# Patient Record
Sex: Male | Born: 1955 | Race: White | Hispanic: No | Smoking: Never smoker
Health system: Southern US, Community
[De-identification: ages and names within clinical notes are randomized; demographics above are authoritative.]

## PROBLEM LIST (undated history)

## (undated) DIAGNOSIS — H44813 Hemophthalmos, bilateral: Secondary | ICD-10-CM

## (undated) DIAGNOSIS — I10 Essential (primary) hypertension: Secondary | ICD-10-CM

## (undated) DIAGNOSIS — E079 Disorder of thyroid, unspecified: Secondary | ICD-10-CM

## (undated) DIAGNOSIS — T7840XA Allergy, unspecified, initial encounter: Secondary | ICD-10-CM

## (undated) HISTORY — PX: THYROID SURGERY: SHX805

## (undated) HISTORY — DX: Allergy, unspecified, initial encounter: T78.40XA

---

## 2006-04-30 ENCOUNTER — Emergency Department (HOSPITAL_COMMUNITY): Admission: EM | Admit: 2006-04-30 | Discharge: 2006-05-01 | Payer: Self-pay | Admitting: Emergency Medicine

## 2009-05-17 ENCOUNTER — Emergency Department (HOSPITAL_BASED_OUTPATIENT_CLINIC_OR_DEPARTMENT_OTHER): Admission: EM | Admit: 2009-05-17 | Discharge: 2009-05-17 | Payer: Self-pay | Admitting: Emergency Medicine

## 2012-11-17 ENCOUNTER — Ambulatory Visit: Payer: Self-pay | Admitting: Family Medicine

## 2012-11-20 ENCOUNTER — Ambulatory Visit: Payer: Self-pay | Admitting: Family Medicine

## 2014-11-07 ENCOUNTER — Encounter: Payer: Self-pay | Admitting: Internal Medicine

## 2014-12-04 ENCOUNTER — Encounter (HOSPITAL_BASED_OUTPATIENT_CLINIC_OR_DEPARTMENT_OTHER): Payer: Self-pay | Admitting: Emergency Medicine

## 2014-12-04 ENCOUNTER — Emergency Department (HOSPITAL_BASED_OUTPATIENT_CLINIC_OR_DEPARTMENT_OTHER)
Admission: EM | Admit: 2014-12-04 | Discharge: 2014-12-05 | Disposition: A | Discharge status: Home / Self Care | Payer: BLUE CROSS/BLUE SHIELD | Attending: Emergency Medicine | Admitting: Emergency Medicine

## 2014-12-04 DIAGNOSIS — Z79899 Other long term (current) drug therapy: Secondary | ICD-10-CM | POA: Diagnosis not present

## 2014-12-04 DIAGNOSIS — N289 Disorder of kidney and ureter, unspecified: Secondary | ICD-10-CM | POA: Insufficient documentation

## 2014-12-04 DIAGNOSIS — Z8639 Personal history of other endocrine, nutritional and metabolic disease: Secondary | ICD-10-CM | POA: Insufficient documentation

## 2014-12-04 DIAGNOSIS — R030 Elevated blood-pressure reading, without diagnosis of hypertension: Secondary | ICD-10-CM | POA: Diagnosis present

## 2014-12-04 DIAGNOSIS — I10 Essential (primary) hypertension: Secondary | ICD-10-CM | POA: Insufficient documentation

## 2014-12-04 HISTORY — DX: Essential (primary) hypertension: I10

## 2014-12-04 HISTORY — DX: Disorder of thyroid, unspecified: E07.9

## 2014-12-04 LAB — BASIC METABOLIC PANEL
Anion gap: 2 — ABNORMAL LOW (ref 5–15)
BUN: 28 mg/dL — AB (ref 6–23)
CO2: 24 mmol/L (ref 19–32)
Calcium: 8.6 mg/dL (ref 8.4–10.5)
Chloride: 113 mmol/L — ABNORMAL HIGH (ref 96–112)
Creatinine, Ser: 2.06 mg/dL — ABNORMAL HIGH (ref 0.50–1.35)
GFR calc Af Amer: 39 mL/min — ABNORMAL LOW (ref >90)
GFR, EST NON AFRICAN AMERICAN: 34 mL/min — AB (ref >90)
GLUCOSE: 101 mg/dL — AB (ref 70–99)
POTASSIUM: 3.9 mmol/L (ref 3.5–5.1)
Sodium: 139 mmol/L (ref 135–145)

## 2014-12-04 NOTE — ED Provider Notes (Signed)
CSN: [id number]     Arrival date & time 12/04/14  2113 History  This chart was scribed for Wynetta Fines, MD by Dellis Filbert, ED Scribe. The patient was seen in [place] and the patient's care was started at 11:05 PM.  Chief Complaint  Patient presents with  . Hypertension   HPI  HPI Comments: Howard Green is a 59 y.o. male who presents to the Emergency Department complaining of elevated blood pressure. Pt was diagnosed with hypertension one month ago after a visit with his eye doctor. He was seen at [facility] and started on lisinopril initially at 10 mg. His dose was then increased to 20 mg and finally on February 26 his dose was increased to 30 mg daily taken in the morning. Pt was advised to chart his blood pressure. His values are typically around 150-160s/80s. Tonight his blood pressure was noted to be in the 189-202/80s, which is not typical for him. He denies symptoms, specifically no blurred vision, shortness of breath, chest pain or headache.   Past Medical History  Diagnosis Date  . Hypertension   . Thyroid disease    No past surgical history on file. No family history on file. History  Substance Use Topics  . Smoking status: Not on file  . Smokeless tobacco: Not on file  . Alcohol Use: Not on file    Review of Systems A complete 10 system review of systems was obtained and all systems are negative except as noted in the HPI and PMH.   Allergies  Review of patient's allergies indicates no known allergies.  Home Medications   Prior to Admission medications   Medication Sig Start Date End Date Taking? Authorizing Provider  lisinopril (PRINIVIL,ZESTRIL) 30 MG tablet Take 30 mg by mouth daily.   Yes Historical Provider, MD   BP 199/105 mmHg  Pulse 83  Temp(Src) 98.4 F (36.9 C) (Oral)  Resp 18  Ht 6\' 1"  (1.854 m)  Wt 245 lb (111.131 kg)  BMI 32.33 kg/m2  SpO2 98%   Physical Exam  Nursing note and vitals reviewed. General: Well-developed,  well-nourished male in no acute distress; appearance consistent with age of record HENT: normocephalic; atraumatic Eyes: pupils equal, round and reactive to light; extraocular muscles intact Neck: supple Heart: regular rate and rhythm; no murmur Lungs: clear to auscultation bilaterally Abdomen: soft; nondistended; nontender; no masses or hepatosplenomegaly; bowel sounds present Extremities: No deformity; full range of motion; pulses normal; no edema Neurologic: Awake, alert and oriented; motor function intact in all extremities and symmetric; no facial droop Skin: Warm and dry Psychiatric: Normal mood and affect  ED Course  Procedures  DIAGNOSTIC STUDIES: Oxygen Saturation is 98% on room air, normal by my interpretation.    COORDINATION OF CARE: 11:13 PM Discussed treatment plan with pt at bedside and pt agreed to plan.   MDM   Final diagnoses:  None   Results for orders placed or performed during the hospital encounter of 24/58/09  Basic metabolic panel  Result Value Ref Range   Sodium 139 135 - 145 mmol/L   Potassium 3.9 3.5 - 5.1 mmol/L   Chloride 113 (H) 96 - 112 mmol/L   CO2 24 19 - 32 mmol/L   Glucose, Bld 101 (H) 70 - 99 mg/dL   BUN 28 (H) 6 - 23 mg/dL   Creatinine, Ser 2.06 (H) 0.50 - 1.35 mg/dL   Calcium 8.6 8.4 - 10.5 mg/dL   GFR calc non Af Amer 34 (L) >90  mL/min   GFR calc Af Amer 39 (L) >90 mL/min   Anion gap 2 (L) 5 - 15   Will start on Toprol-XL 25mg  daily and have him follow up with his PCP.  I personally performed the services described in this documentation, which was scribed in my presence. The recorded information has been reviewed and is accurate.    Wynetta Fines, MD 12/05/14 [phone]

## 2014-12-04 NOTE — ED Notes (Signed)
Pt diagnosed with HTN 1 month ago, started on meds, meds increased, high BP continues; extra high tonight

## 2014-12-05 MED ORDER — METOPROLOL SUCCINATE ER 25 MG PO TB24: 25.0000 mg | ORAL_TABLET | Freq: Every day | ORAL | Status: DC

## 2014-12-22 ENCOUNTER — Encounter (HOSPITAL_BASED_OUTPATIENT_CLINIC_OR_DEPARTMENT_OTHER): Payer: Self-pay | Admitting: *Deleted

## 2014-12-22 ENCOUNTER — Emergency Department (HOSPITAL_BASED_OUTPATIENT_CLINIC_OR_DEPARTMENT_OTHER)
Admission: EM | Admit: 2014-12-22 | Discharge: 2014-12-23 | Disposition: A | Discharge status: Home / Self Care | Payer: BLUE CROSS/BLUE SHIELD | Attending: Emergency Medicine | Admitting: Emergency Medicine

## 2014-12-22 DIAGNOSIS — I10 Essential (primary) hypertension: Secondary | ICD-10-CM | POA: Insufficient documentation

## 2014-12-22 DIAGNOSIS — Z8639 Personal history of other endocrine, nutritional and metabolic disease: Secondary | ICD-10-CM | POA: Diagnosis not present

## 2014-12-22 DIAGNOSIS — Z79899 Other long term (current) drug therapy: Secondary | ICD-10-CM | POA: Diagnosis not present

## 2014-12-22 DIAGNOSIS — I160 Hypertensive urgency: Secondary | ICD-10-CM

## 2014-12-22 MED ORDER — CLONIDINE HCL 0.1 MG PO TABS
0.2000 mg | ORAL_TABLET | Freq: Once | ORAL | Status: AC
  Administered 2014-12-23: 0.2 mg via ORAL
  Filled 2014-12-22: qty 2

## 2014-12-22 NOTE — ED Provider Notes (Signed)
CSN: [id number]     Arrival date & time 12/22/14  2252 History   This chart was scribed for Veryl Speak, MD by Chester Holstein, ED Scribe. This patient was seen in room MH01/MH01 and the patient's care was started at 11:53 PM.      Chief Complaint  Patient presents with  . Hypertension     Patient is a 59 y.o. male presenting with hypertension. The history is provided by the patient. No language interpreter was used.  Hypertension   HPI Comments: Steven Basso is a 59 y.o. male who presents to the Emergency Department complaining of elevated blood pressure with gradually increasing today. Pt states he was diagnosed with HTN in February 2016. Pt states he was seen at an ED in [place] at that time and was placed on lisinopril. Pt was seen in ED on 12/04/14 for same and was placed on metoprolol in addition to the lisinopril. Pt notes BP was 150/79 at 10 PM last night. Pt states he has been checking BP today and realized it was increasing. Pt denies headache, chest pain, SOB, and visual disturbances.  11:55 PM  Pt's BP is 207/93  Past Medical History  Diagnosis Date  . Hypertension   . Thyroid disease    Past Surgical History  Procedure Laterality Date  . Thyroid surgery     No family history on file. History  Substance Use Topics  . Smoking status: Never Smoker   . Smokeless tobacco: Not on file  . Alcohol Use: No    Review of Systems A complete 10 system review of systems was obtained and all systems are negative except as noted in the HPI and PMH.     Allergies  Review of patient's allergies indicates no known allergies.  Home Medications   Prior to Admission medications   Medication Sig Start Date End Date Taking? Authorizing Provider  lisinopril (PRINIVIL,ZESTRIL) 30 MG tablet Take 30 mg by mouth daily.    Historical Provider, MD  metoprolol succinate (TOPROL-XL) 25 MG 24 hr tablet Take 1 tablet (25 mg total) by mouth daily. 12/05/14   John Molpus, MD   BP 201/87  mmHg  Pulse 73  Temp(Src) 98.6 F (37 C) (Oral)  Resp 18  Ht 6\' 1"  (1.854 m)  Wt 245 lb (111.131 kg)  BMI 32.33 kg/m2  SpO2 99% Physical Exam  Constitutional: He is oriented to person, place, and time. He appears well-developed and well-nourished.  HENT:  Head: Normocephalic and atraumatic.  Eyes: Conjunctivae and EOM are normal. Pupils are equal, round, and reactive to light.  Neck: Normal range of motion. Neck supple.  Cardiovascular: Normal rate, regular rhythm, normal heart sounds and intact distal pulses.   Pulmonary/Chest: Effort normal and breath sounds normal. No respiratory distress. He has no wheezes. He has no rales.  Abdominal: Soft. He exhibits no distension. There is no tenderness.  Musculoskeletal: Normal range of motion.  Neurological: He is alert and oriented to person, place, and time. No cranial nerve deficit or sensory deficit.  Skin: Skin is warm and dry.  Psychiatric: He has a normal mood and affect. His behavior is normal. Judgment normal.  Nursing note and vitals reviewed.   ED Course  Procedures (including critical care time) DIAGNOSTIC STUDIES: Oxygen Saturation is 99% on room air, normal by my interpretation.    COORDINATION OF CARE: 12:00 AM Discussed treatment plan with patient at beside, the patient agrees with the plan and has no further questions at this time.  Labs Review Labs Reviewed - No data to display  Imaging Review No results found.  ED ECG REPORT   Date: 12/23/2014  Rate: 72  Rhythm: normal sinus rhythm  QRS Axis: normal  Intervals: normal  ST/T Wave abnormalities: normal  Conduction Disutrbances:none  Narrative Interpretation:   Old EKG Reviewed: none available  I have personally reviewed the EKG tracing and agree with the computerized printout as noted.   MDM   Final diagnoses:  None    Patient is a 59 year old male who presents for evaluation of elevated blood pressure. Was seen for similar complaints 2 weeks ago  and started back on his metoprolol. He checked his blood pressure this evening and it was over 206 solid. He presents for evaluation of this. He is neurologically intact and workup reveals no sign of end organ damage. He was given an additional dose of clonidine and he responded well to this. He will be discharged with oral clonidine sad to his antihypertensive regimen for the short-term. He is to keep a record of his blood pressures and present these to his doctor when he follows up in one week.   I personally performed the services described in this documentation, which was scribed in my presence. The recorded information has been reviewed and is accurate.      Veryl Speak, MD 12/23/14 [phone]

## 2014-12-22 NOTE — ED Notes (Signed)
H/o new hypertension since February. Noticed tonight after checking BP at home that it is gradually increasing progressively. Denies any sx. Taking toprol and lisinopril.

## 2014-12-23 ENCOUNTER — Ambulatory Visit (AMBULATORY_SURGERY_CENTER): Payer: Self-pay | Admitting: *Deleted

## 2014-12-23 VITALS — Ht 72.5 in | Wt 241.6 lb

## 2014-12-23 DIAGNOSIS — Z1211 Encounter for screening for malignant neoplasm of colon: Secondary | ICD-10-CM

## 2014-12-23 LAB — CBC WITH DIFFERENTIAL/PLATELET
BASOS PCT: 1 % (ref 0–1)
Basophils Absolute: 0.1 10*3/uL (ref 0.0–0.1)
EOS ABS: 0.4 10*3/uL (ref 0.0–0.7)
Eosinophils Relative: 3 % (ref 0–5)
HEMATOCRIT: 36.9 % — AB (ref 39.0–52.0)
HEMOGLOBIN: 12 g/dL — AB (ref 13.0–17.0)
LYMPHS PCT: 19 % (ref 12–46)
Lymphs Abs: 2.2 10*3/uL (ref 0.7–4.0)
MCH: 27.4 pg (ref 26.0–34.0)
MCHC: 32.5 g/dL (ref 30.0–36.0)
MCV: 84.2 fL (ref 78.0–100.0)
Monocytes Absolute: 0.9 10*3/uL (ref 0.1–1.0)
Monocytes Relative: 8 % (ref 3–12)
NEUTROS ABS: 8.1 10*3/uL — AB (ref 1.7–7.7)
Neutrophils Relative %: 69 % (ref 43–77)
Platelets: 743 10*3/uL — ABNORMAL HIGH (ref 150–400)
RBC: 4.38 MIL/uL (ref 4.22–5.81)
RDW: 14.8 % (ref 11.5–15.5)
WBC: 11.7 10*3/uL — ABNORMAL HIGH (ref 4.0–10.5)

## 2014-12-23 LAB — BASIC METABOLIC PANEL
ANION GAP: 8 (ref 5–15)
BUN: 27 mg/dL — ABNORMAL HIGH (ref 6–23)
CHLORIDE: 110 mmol/L (ref 96–112)
CO2: 24 mmol/L (ref 19–32)
CREATININE: 2.04 mg/dL — AB (ref 0.50–1.35)
Calcium: 8.6 mg/dL (ref 8.4–10.5)
GFR calc Af Amer: 39 mL/min — ABNORMAL LOW (ref >90)
GFR calc non Af Amer: 34 mL/min — ABNORMAL LOW (ref >90)
Glucose, Bld: 115 mg/dL — ABNORMAL HIGH (ref 70–99)
Potassium: 3.8 mmol/L (ref 3.5–5.1)
SODIUM: 142 mmol/L (ref 135–145)

## 2014-12-23 MED ORDER — MOVIPREP 100 G PO SOLR: 1.0000 | Freq: Once | ORAL | Status: DC

## 2014-12-23 MED ORDER — CLONIDINE HCL 0.1 MG PO TABS: 0.1000 mg | ORAL_TABLET | Freq: Two times a day (BID) | ORAL | Status: DC

## 2014-12-23 NOTE — Progress Notes (Signed)
No egg or soy allergy No issues with past sedation No home 02  No diet pills No issues with intubation emmi video to email

## 2014-12-23 NOTE — Discharge Instructions (Signed)
Continue your medications as before. Add clonidine as prescribed today.  Keep a record of your blood pressures for the next week and take this to your primary doctor at your next appointment.  Return to the emergency department for any new and concerning symptoms.   Hypertension Hypertension, commonly called high blood pressure, is when the force of blood pumping through your arteries is too strong. Your arteries are the blood vessels that carry blood from your heart throughout your body. A blood pressure reading consists of a higher number over a lower number, such as 110/72. The higher number (systolic) is the pressure inside your arteries when your heart pumps. The lower number (diastolic) is the pressure inside your arteries when your heart relaxes. Ideally you want your blood pressure below 120/80. Hypertension forces your heart to work harder to pump blood. Your arteries may become narrow or stiff. Having hypertension puts you at risk for heart disease, stroke, and other problems.  RISK FACTORS Some risk factors for high blood pressure are controllable. Others are not.  Risk factors you cannot control include:   Race. You may be at higher risk if you are African American.  Age. Risk increases with age.  Gender. Men are at higher risk than women before age 69 years. After age 50, women are at higher risk than men. Risk factors you can control include:  Not getting enough exercise or physical activity.  Being overweight.  Getting too much fat, sugar, calories, or salt in your diet.  Drinking too much alcohol. SIGNS AND SYMPTOMS Hypertension does not usually cause signs or symptoms. Extremely high blood pressure (hypertensive crisis) may cause headache, anxiety, shortness of breath, and nosebleed. DIAGNOSIS  To check if you have hypertension, your health care provider will measure your blood pressure while you are seated, with your arm held at the level of your heart. It should be  measured at least twice using the same arm. Certain conditions can cause a difference in blood pressure between your right and left arms. A blood pressure reading that is higher than normal on one occasion does not mean that you need treatment. If one blood pressure reading is high, ask your health care provider about having it checked again. TREATMENT  Treating high blood pressure includes making lifestyle changes and possibly taking medicine. Living a healthy lifestyle can help lower high blood pressure. You may need to change some of your habits. Lifestyle changes may include:  Following the DASH diet. This diet is high in fruits, vegetables, and whole grains. It is low in salt, red meat, and added sugars.  Getting at least 2 hours of brisk physical activity every week.  Losing weight if necessary.  Not smoking.  Limiting alcoholic beverages.  Learning ways to reduce stress. If lifestyle changes are not enough to get your blood pressure under control, your health care provider may prescribe medicine. You may need to take more than one. Work closely with your health care provider to understand the risks and benefits. HOME CARE INSTRUCTIONS  Have your blood pressure rechecked as directed by your health care provider.   Take medicines only as directed by your health care provider. Follow the directions carefully. Blood pressure medicines must be taken as prescribed. The medicine does not work as well when you skip doses. Skipping doses also puts you at risk for problems.   Do not smoke.   Monitor your blood pressure at home as directed by your health care provider. SEEK MEDICAL CARE IF:  You think you are having a reaction to medicines taken.  You have recurrent headaches or feel dizzy.  You have swelling in your ankles.  You have trouble with your vision. SEEK IMMEDIATE MEDICAL CARE IF:  You develop a severe headache or confusion.  You have unusual weakness, numbness,  or feel faint.  You have severe chest or abdominal pain.  You vomit repeatedly.  You have trouble breathing. MAKE SURE YOU:   Understand these instructions.  Will watch your condition.  Will get help right away if you are not doing well or get worse. Document Released: 09/20/2005 Document Revised: 02/04/2014 Document Reviewed: 07/13/2013 [facility] Patient Information 2015 [place], [place]. This information is not intended to replace advice given to you by your health care provider. Make sure you discuss any questions you have with your health care provider.

## 2014-12-31 ENCOUNTER — Ambulatory Visit (INDEPENDENT_AMBULATORY_CARE_PROVIDER_SITE_OTHER): Payer: BLUE CROSS/BLUE SHIELD | Admitting: Internal Medicine

## 2014-12-31 ENCOUNTER — Encounter: Payer: Self-pay | Admitting: Internal Medicine

## 2014-12-31 VITALS — BP 172/74 | HR 69 | Temp 98.8°F | Resp 18 | Ht 73.0 in | Wt 234.0 lb

## 2014-12-31 DIAGNOSIS — Z0000 Encounter for general adult medical examination without abnormal findings: Secondary | ICD-10-CM | POA: Diagnosis not present

## 2014-12-31 DIAGNOSIS — N183 Chronic kidney disease, stage 3 (moderate): Secondary | ICD-10-CM | POA: Diagnosis not present

## 2014-12-31 DIAGNOSIS — N179 Acute kidney failure, unspecified: Secondary | ICD-10-CM

## 2014-12-31 DIAGNOSIS — I10 Essential (primary) hypertension: Secondary | ICD-10-CM | POA: Diagnosis not present

## 2014-12-31 MED ORDER — METOPROLOL SUCCINATE ER 50 MG PO TB24: 50.0000 mg | ORAL_TABLET | Freq: Every day | ORAL | Status: DC

## 2014-12-31 MED ORDER — CLONIDINE HCL 0.2 MG PO TABS: 0.2000 mg | ORAL_TABLET | Freq: Two times a day (BID) | ORAL | Status: DC

## 2014-12-31 NOTE — Progress Notes (Signed)
Pre visit review using our clinic review tool, if applicable. No additional management support is needed unless otherwise documented below in the visit note. 

## 2014-12-31 NOTE — Patient Instructions (Addendum)
We will have you stop taking the lisinopril. We will recheck the blood numbers in about 2 weeks to see if the kidneys are doing better. You can go down to the lab anytime between the 11th to the 15th to get it done then come back to see me the next week.   We are increasing the clonidine to 0.2 mg twice a day. I have sent in a new prescription to the [facility]. I have also increased the metoprolol to 50 mg daily and have sent that as well to the [facility].   If you notice the blood pressure going up at home once you change the medicines please call the office sooner to adjust things.   I will send a note to Dr. Henrene Pastor to delay the colon cancer screening.   If the blood test is still showing the damage to the kidney next time we will likely have you do a couple of tests to check on the kidneys.   Hypertension Hypertension, commonly called high blood pressure, is when the force of blood pumping through your arteries is too strong. Your arteries are the blood vessels that carry blood from your heart throughout your body. A blood pressure reading consists of a higher number over a lower number, such as 110/72. The higher number (systolic) is the pressure inside your arteries when your heart pumps. The lower number (diastolic) is the pressure inside your arteries when your heart relaxes. Ideally you want your blood pressure below 120/80. Hypertension forces your heart to work harder to pump blood. Your arteries may become narrow or stiff. Having hypertension puts you at risk for heart disease, stroke, and other problems.  RISK FACTORS Some risk factors for high blood pressure are controllable. Others are not.  Risk factors you cannot control include:   Race. You may be at higher risk if you are African American.  Age. Risk increases with age.  Gender. Men are at higher risk than women before age 13 years. After age 71, women are at higher risk than men. Risk factors you can control include:  Not  getting enough exercise or physical activity.  Being overweight.  Getting too much fat, sugar, calories, or salt in your diet.  Drinking too much alcohol. SIGNS AND SYMPTOMS Hypertension does not usually cause signs or symptoms. Extremely high blood pressure (hypertensive crisis) may cause headache, anxiety, shortness of breath, and nosebleed. DIAGNOSIS  To check if you have hypertension, your health care provider will measure your blood pressure while you are seated, with your arm held at the level of your heart. It should be measured at least twice using the same arm. Certain conditions can cause a difference in blood pressure between your right and left arms. A blood pressure reading that is higher than normal on one occasion does not mean that you need treatment. If one blood pressure reading is high, ask your health care provider about having it checked again. TREATMENT  Treating high blood pressure includes making lifestyle changes and possibly taking medicine. Living a healthy lifestyle can help lower high blood pressure. You may need to change some of your habits. Lifestyle changes may include:  Following the DASH diet. This diet is high in fruits, vegetables, and whole grains. It is low in salt, red meat, and added sugars.  Getting at least 2 hours of brisk physical activity every week.  Losing weight if necessary.  Not smoking.  Limiting alcoholic beverages.  Learning ways to reduce stress. If lifestyle changes  are not enough to get your blood pressure under control, your health care provider may prescribe medicine. You may need to take more than one. Work closely with your health care provider to understand the risks and benefits. HOME CARE INSTRUCTIONS  Have your blood pressure rechecked as directed by your health care provider.   Take medicines only as directed by your health care provider. Follow the directions carefully. Blood pressure medicines must be taken as  prescribed. The medicine does not work as well when you skip doses. Skipping doses also puts you at risk for problems.   Do not smoke.   Monitor your blood pressure at home as directed by your health care provider. SEEK MEDICAL CARE IF:   You think you are having a reaction to medicines taken.  You have recurrent headaches or feel dizzy.  You have swelling in your ankles.  You have trouble with your vision. SEEK IMMEDIATE MEDICAL CARE IF:  You develop a severe headache or confusion.  You have unusual weakness, numbness, or feel faint.  You have severe chest or abdominal pain.  You vomit repeatedly.  You have trouble breathing. MAKE SURE YOU:   Understand these instructions.  Will watch your condition.  Will get help right away if you are not doing well or get worse. Document Released: 09/20/2005 Document Revised: 02/04/2014 Document Reviewed: 07/13/2013 [facility] Patient Information 2015 [place], [place]. This information is not intended to replace advice given to you by your health care provider. Make sure you discuss any questions you have with your health care provider.

## 2015-01-03 ENCOUNTER — Encounter: Payer: Self-pay | Admitting: Internal Medicine

## 2015-01-03 DIAGNOSIS — I10 Essential (primary) hypertension: Secondary | ICD-10-CM | POA: Insufficient documentation

## 2015-01-03 DIAGNOSIS — N179 Acute kidney failure, unspecified: Secondary | ICD-10-CM | POA: Insufficient documentation

## 2015-01-03 NOTE — Assessment & Plan Note (Signed)
BP elevated today and will stop lisinopril per discussion of AKI. Increase clonidine to 0.2 mg BID and continue metoprolol. If needs another agent would add hydralazine next. Repeat labs in 2 weeks off lisinopril.

## 2015-01-03 NOTE — Progress Notes (Signed)
   Subjective:    Patient ID: Howard Green, male    DOB: 1956/02/26, 59 y.o.   MRN: [id number]  HPI The patient is a new 59 YO man who is coming in to talk about his blood pressure and his kidney function. He did not know about his blood pressure until at his eye doctor they took it and sent him to an urgent care. He was then started on a blood pressure medication and went back 1-2 weeks later. He still had high blood pressure and then decided to switch to a different urgent care where they did blood work and added a blood pressure agent. He went back again 2-3 weeks later and did another kidney test (they let him know that the first one had some problems). He thought he should get a regular doctor instead of seeing someone different every time. Denies headaches, chest pains, nausea, confusion. Has not had his pressure checked in some time before all this and never knew he had any health problems.   PMH, [facility], social history reviewed and updated with the patient.   Review of Systems  Constitutional: Negative for fever, chills, activity change, appetite change, fatigue and unexpected weight change.  HENT: Negative.   Eyes: Negative.   Respiratory: Negative for cough, chest tightness, shortness of breath and wheezing.   Cardiovascular: Negative for chest pain, palpitations and leg swelling.  Gastrointestinal: Negative for nausea, abdominal pain, diarrhea and abdominal distention.  Genitourinary: Negative.   Musculoskeletal: Negative.   Skin: Negative.   Neurological: Negative.   Psychiatric/Behavioral: Negative.       Objective:   Physical Exam  Constitutional: He is oriented to person, place, and time. He appears well-developed and well-nourished.  HENT:  Head: Normocephalic and atraumatic.  Eyes: EOM are normal.  Neck: Normal range of motion.  Cardiovascular: Normal rate and regular rhythm.   No murmur heard. Pulmonary/Chest: Effort normal and breath sounds normal.  Abdominal:  Soft. Bowel sounds are normal. He exhibits no distension. There is no tenderness. There is no rebound.  Musculoskeletal: He exhibits no edema.  Neurological: He is alert and oriented to person, place, and time. Coordination normal.  Skin: Skin is warm and dry.   Filed Vitals:   12/31/14 1436  BP: 172/74  Pulse: 69  Temp: 98.8 F (37.1 C)  TempSrc: Oral  Resp: 18  Height: 6\' 1"  (1.854 m)  Weight: 234 lb (106.142 kg)  SpO2: 97%      Assessment & Plan:

## 2015-01-03 NOTE — Assessment & Plan Note (Signed)
Reviewed blood work from the urgent care and his creatinine is elevated with reduced kidney function. Will assume for now that some of this is caused by lisinopril which was the first blood pressure agent started. Will stop lisinopril and increase his clonidine for better BP control. Return in 2 weeks for labs and BP check. May need increase in meds then. If no improvement in his kidney function at that time will do renal ultrasound and workup.

## 2015-01-06 ENCOUNTER — Telehealth: Payer: Self-pay | Admitting: Internal Medicine

## 2015-01-06 ENCOUNTER — Encounter: Payer: BLUE CROSS/BLUE SHIELD | Admitting: Internal Medicine

## 2015-01-14 ENCOUNTER — Other Ambulatory Visit (INDEPENDENT_AMBULATORY_CARE_PROVIDER_SITE_OTHER): Payer: BLUE CROSS/BLUE SHIELD

## 2015-01-14 DIAGNOSIS — N183 Chronic kidney disease, stage 3 (moderate): Secondary | ICD-10-CM | POA: Diagnosis not present

## 2015-01-14 DIAGNOSIS — Z0000 Encounter for general adult medical examination without abnormal findings: Secondary | ICD-10-CM

## 2015-01-14 LAB — LIPID PANEL
CHOLESTEROL: 137 mg/dL (ref 0–200)
HDL: 25.4 mg/dL — ABNORMAL LOW (ref >39.00)
LDL Cholesterol: 84 mg/dL (ref 0–99)
NonHDL: 111.6
TRIGLYCERIDES: 139 mg/dL (ref 0.0–149.0)
Total CHOL/HDL Ratio: 5
VLDL: 27.8 mg/dL (ref 0.0–40.0)

## 2015-01-14 LAB — COMPREHENSIVE METABOLIC PANEL
ALBUMIN: 4.1 g/dL (ref 3.5–5.2)
ALT: 11 U/L (ref 0–53)
AST: 14 U/L (ref 0–37)
Alkaline Phosphatase: 53 U/L (ref 39–117)
BUN: 29 mg/dL — ABNORMAL HIGH (ref 6–23)
CALCIUM: 9.1 mg/dL (ref 8.4–10.5)
CO2: 28 meq/L (ref 19–32)
CREATININE: 2.17 mg/dL — AB (ref 0.40–1.50)
Chloride: 108 mEq/L (ref 96–112)
GFR: 33.17 mL/min — AB (ref >60.00)
GLUCOSE: 108 mg/dL — AB (ref 70–99)
POTASSIUM: 4.5 meq/L (ref 3.5–5.1)
SODIUM: 139 meq/L (ref 135–145)
TOTAL PROTEIN: 6.9 g/dL (ref 6.0–8.3)
Total Bilirubin: 0.5 mg/dL (ref 0.2–1.2)

## 2015-01-14 LAB — HEMOGLOBIN A1C: HEMOGLOBIN A1C: 5.5 % (ref 4.6–6.5)

## 2015-01-16 ENCOUNTER — Other Ambulatory Visit: Payer: Self-pay | Admitting: Internal Medicine

## 2015-01-16 DIAGNOSIS — N289 Disorder of kidney and ureter, unspecified: Secondary | ICD-10-CM

## 2015-01-19 ENCOUNTER — Emergency Department (HOSPITAL_BASED_OUTPATIENT_CLINIC_OR_DEPARTMENT_OTHER): Payer: BLUE CROSS/BLUE SHIELD

## 2015-01-19 ENCOUNTER — Encounter (HOSPITAL_BASED_OUTPATIENT_CLINIC_OR_DEPARTMENT_OTHER): Payer: Self-pay | Admitting: Emergency Medicine

## 2015-01-19 ENCOUNTER — Emergency Department (HOSPITAL_BASED_OUTPATIENT_CLINIC_OR_DEPARTMENT_OTHER)
Admission: EM | Admit: 2015-01-19 | Discharge: 2015-01-19 | Disposition: A | Discharge status: Home / Self Care | Payer: BLUE CROSS/BLUE SHIELD | Attending: Emergency Medicine | Admitting: Emergency Medicine

## 2015-01-19 DIAGNOSIS — I10 Essential (primary) hypertension: Secondary | ICD-10-CM | POA: Diagnosis not present

## 2015-01-19 DIAGNOSIS — R200 Anesthesia of skin: Secondary | ICD-10-CM | POA: Diagnosis not present

## 2015-01-19 DIAGNOSIS — Z79899 Other long term (current) drug therapy: Secondary | ICD-10-CM | POA: Diagnosis not present

## 2015-01-19 DIAGNOSIS — Z8639 Personal history of other endocrine, nutritional and metabolic disease: Secondary | ICD-10-CM | POA: Insufficient documentation

## 2015-01-19 DIAGNOSIS — R202 Paresthesia of skin: Secondary | ICD-10-CM | POA: Diagnosis present

## 2015-01-19 LAB — CBC WITH DIFFERENTIAL/PLATELET
BASOS PCT: 1 % (ref 0–1)
Basophils Absolute: 0.1 10*3/uL (ref 0.0–0.1)
Eosinophils Absolute: 0.4 10*3/uL (ref 0.0–0.7)
Eosinophils Relative: 3 % (ref 0–5)
HCT: 40.5 % (ref 39.0–52.0)
HEMOGLOBIN: 13 g/dL (ref 13.0–17.0)
LYMPHS ABS: 2.7 10*3/uL (ref 0.7–4.0)
Lymphocytes Relative: 21 % (ref 12–46)
MCH: 27.3 pg (ref 26.0–34.0)
MCHC: 32.1 g/dL (ref 30.0–36.0)
MCV: 84.9 fL (ref 78.0–100.0)
MONO ABS: 0.9 10*3/uL (ref 0.1–1.0)
Monocytes Relative: 7 % (ref 3–12)
NEUTROS PCT: 68 % (ref 43–77)
Neutro Abs: 8.8 10*3/uL — ABNORMAL HIGH (ref 1.7–7.7)
Platelets: 752 10*3/uL — ABNORMAL HIGH (ref 150–400)
RBC: 4.77 MIL/uL (ref 4.22–5.81)
RDW: 15.2 % (ref 11.5–15.5)
WBC: 12.9 10*3/uL — AB (ref 4.0–10.5)

## 2015-01-19 LAB — URINALYSIS, ROUTINE W REFLEX MICROSCOPIC
Bilirubin Urine: NEGATIVE
GLUCOSE, UA: NEGATIVE mg/dL
Hgb urine dipstick: NEGATIVE
Ketones, ur: NEGATIVE mg/dL
Leukocytes, UA: NEGATIVE
Nitrite: NEGATIVE
Protein, ur: NEGATIVE mg/dL
Specific Gravity, Urine: 1.01 (ref 1.005–1.030)
Urobilinogen, UA: 0.2 mg/dL (ref 0.0–1.0)
pH: 6.5 (ref 5.0–8.0)

## 2015-01-19 LAB — COMPREHENSIVE METABOLIC PANEL
ALBUMIN: 4.5 g/dL (ref 3.5–5.2)
ALK PHOS: 57 U/L (ref 39–117)
ALT: 16 U/L (ref 0–53)
ANION GAP: 8 (ref 5–15)
AST: 20 U/L (ref 0–37)
BILIRUBIN TOTAL: 0.8 mg/dL (ref 0.3–1.2)
BUN: 35 mg/dL — AB (ref 6–23)
CHLORIDE: 105 mmol/L (ref 96–112)
CO2: 26 mmol/L (ref 19–32)
CREATININE: 2.31 mg/dL — AB (ref 0.50–1.35)
Calcium: 9.3 mg/dL (ref 8.4–10.5)
GFR calc non Af Amer: 29 mL/min — ABNORMAL LOW (ref >90)
GFR, EST AFRICAN AMERICAN: 34 mL/min — AB (ref >90)
GLUCOSE: 103 mg/dL — AB (ref 70–99)
POTASSIUM: 3.9 mmol/L (ref 3.5–5.1)
SODIUM: 139 mmol/L (ref 135–145)
Total Protein: 7.7 g/dL (ref 6.0–8.3)

## 2015-01-19 LAB — TROPONIN I

## 2015-01-19 MED ORDER — LABETALOL HCL 5 MG/ML IV SOLN
INTRAVENOUS | Status: AC
  Administered 2015-01-19: 10 mg via INTRAVENOUS
  Filled 2015-01-19: qty 4

## 2015-01-19 MED ORDER — LABETALOL HCL 5 MG/ML IV SOLN
10.0000 mg | Freq: Once | INTRAVENOUS | Status: AC
  Administered 2015-01-19: 10 mg via INTRAVENOUS

## 2015-01-19 NOTE — Discharge Instructions (Signed)
Begin taking 81 mg aspirin daily today.  Call the number for neurology and your PCP first thing on Monday Transient Ischemic Attack A transient ischemic attack (TIA) is a "warning stroke" that causes stroke-like symptoms. Unlike a stroke, a TIA does not cause permanent damage to the brain. The symptoms of a TIA can happen very fast and do not last long. It is important to know the symptoms of a TIA and what to do. This can help prevent a major stroke or death. CAUSES   A TIA is caused by a temporary blockage in an artery in the brain or neck (carotid artery). The blockage does not allow the brain to get the blood supply it needs and can cause different symptoms. The blockage can be caused by either:  A blood clot.  Fatty buildup (plaque) in a neck or brain artery. RISK FACTORS  High blood pressure (hypertension).  High cholesterol.  Diabetes mellitus.  Heart disease.  The build up of plaque in the blood vessels (peripheral artery disease or atherosclerosis).  The build up of plaque in the blood vessels providing blood and oxygen to the brain (carotid artery stenosis).  An abnormal heart rhythm (atrial fibrillation).  Obesity.  Smoking.  Taking oral contraceptives (especially in combination with smoking).  Physical inactivity.  A diet high in fats, salt (sodium), and calories.  Alcohol use.  Use of illegal drugs (especially cocaine and methamphetamine).  Being male.  Being African American.  Being over the age of 21.  Family history of stroke.  Previous history of blood clots, stroke, TIA, or heart attack.  Sickle cell disease. SYMPTOMS  TIA symptoms are the same as a stroke but are temporary. These symptoms usually develop suddenly, or may be newly present upon awakening from sleep:  Sudden weakness or numbness of the face, arm, or leg, especially on one side of the body.  Sudden trouble walking or difficulty moving arms or legs.  Sudden confusion.  Sudden  personality changes.  Trouble speaking (aphasia) or understanding.  Difficulty swallowing.  Sudden trouble seeing in one or both eyes.  Double vision.  Dizziness.  Loss of balance or coordination.  Sudden severe headache with no known cause.  Trouble reading or writing.  Loss of bowel or bladder control.  Loss of consciousness. DIAGNOSIS  Your caregiver may be able to determine the presence or absence of a TIA based on your symptoms, history, and physical exam. Computed tomography (CT scan) of the brain is usually performed to help identify a TIA. Other tests may be done to diagnose a TIA. These tests may include:  Electrocardiography.  Continuous heart monitoring.  Echocardiography.  Carotid ultrasonography.  Magnetic resonance imaging (MRI).  A scan of the brain circulation.  Blood tests. PREVENTION  The risk of a TIA can be decreased by appropriately treating high blood pressure, high cholesterol, diabetes, heart disease, and obesity and by quitting smoking, limiting alcohol, and staying physically active. TREATMENT  Time is of the essence. Since the symptoms of TIA are the same as a stroke, it is important to seek treatment as soon as possible because you may need a medicine to dissolve the clot (thrombolytic) that cannot be given if too much time has passed. Treatment options vary. Treatment options may include rest, oxygen, intravenous (IV) fluids, and medicines to thin the blood (anticoagulants). Medicines and diet may be used to address diabetes, high blood pressure, and other risk factors. Measures will be taken to prevent short-term and long-term complications, including infection  from breathing foreign material into the lungs (aspiration pneumonia), blood clots in the legs, and falls. Treatment options include procedures to either remove plaque in the carotid arteries or dilate carotid arteries that have narrowed due to plaque. Those procedures are:  Carotid  endarterectomy.  Carotid angioplasty and stenting. HOME CARE INSTRUCTIONS   Take all medicines prescribed by your caregiver. Follow the directions carefully. Medicines may be used to control risk factors for a stroke. Be sure you understand all your medicine instructions.  You may be told to take aspirin or the anticoagulant warfarin. Warfarin needs to be taken exactly as instructed.  Taking too much or too little warfarin is dangerous. Too much warfarin increases the risk of bleeding. Too little warfarin continues to allow the risk for blood clots. While taking warfarin, you will need to have regular blood tests to measure your blood clotting time. A PT blood test measures how long it takes for blood to clot. Your PT is used to calculate another value called an INR. Your PT and INR help your caregiver to adjust your dose of warfarin. The dose can change for many reasons. It is critically important that you take warfarin exactly as prescribed.  Many foods, especially foods high in vitamin K can interfere with warfarin and affect the PT and INR. Foods high in vitamin K include spinach, kale, broccoli, cabbage, collard and turnip greens, brussels sprouts, peas, cauliflower, seaweed, and parsley as well as beef and pork liver, green tea, and soybean oil. You should eat a consistent amount of foods high in vitamin K. Avoid major changes in your diet, or notify your caregiver before changing your diet. Arrange a visit with a dietitian to answer your questions.  Many medicines can interfere with warfarin and affect the PT and INR. You must tell your caregiver about any and all medicines you take, this includes all vitamins and supplements. Be especially cautious with aspirin and anti-inflammatory medicines. Do not take or discontinue any prescribed or over-the-counter medicine except on the advice of your caregiver or pharmacist.  Warfarin can have side effects, such as excessive bruising or bleeding. You  will need to hold pressure over cuts for longer than usual. Your caregiver or pharmacist will discuss other potential side effects.  Avoid sports or activities that may cause injury or bleeding.  Be mindful when shaving, flossing your teeth, or handling sharp objects.  Alcohol can change the body's ability to handle warfarin. It is best to avoid alcoholic drinks or consume only very small amounts while taking warfarin. Notify your caregiver if you change your alcohol intake.  Notify your dentist or other caregivers before procedures.  Eat a diet that includes 5 or more servings of fruits and vegetables each day. This may reduce the risk of stroke. Certain diets may be prescribed to address high blood pressure, high cholesterol, diabetes, or obesity.  A low-sodium, low-saturated fat, low-trans fat, low-cholesterol diet is recommended to manage high blood pressure.  A low-saturated fat, low-trans fat, low-cholesterol, and high-fiber diet may control cholesterol levels.  A controlled-carbohydrate, controlled-sugar diet is recommended to manage diabetes.  A reduced-calorie, low-sodium, low-saturated fat, low-trans fat, low-cholesterol diet is recommended to manage obesity.  Maintain a healthy weight.  Stay physically active. It is recommended that you get at least 30 minutes of activity on most or all days.  Do not smoke.  Limit alcohol use even if you are not taking warfarin. Moderate alcohol use is considered to be:  No more than 2  drinks each day for men.  No more than 1 drink each day for nonpregnant women.  Stop drug abuse.  Home safety. A safe home environment is important to reduce the risk of falls. Your caregiver may arrange for specialists to evaluate your home. Having grab bars in the bedroom and bathroom is often important. Your caregiver may arrange for equipment to be used at home, such as raised toilets and a seat for the shower.  Follow all instructions for follow-up  with your caregiver. This is very important. This includes any referrals and lab tests. Proper follow up can prevent a stroke or another TIA from occurring. SEEK MEDICAL CARE IF:  You have personality changes.  You have difficulty swallowing.  You are seeing double.  You have dizziness.  You have a fever.  You have skin breakdown. SEEK IMMEDIATE MEDICAL CARE IF:  Any of these symptoms may represent a serious problem that is an emergency. Do not wait to see if the symptoms will go away. Get medical help right away. Call your local emergency services (911 in [place] Do not drive yourself to the hospital.  You have sudden weakness or numbness of the face, arm, or leg, especially on one side of the body.  You have sudden trouble walking or difficulty moving arms or legs.  You have sudden confusion.  You have trouble speaking (aphasia) or understanding.  You have sudden trouble seeing in one or both eyes.  You have a loss of balance or coordination.  You have a sudden, severe headache with no known cause.  You have new chest pain or an irregular heartbeat.  You have a partial or total loss of consciousness. MAKE SURE YOU:   Understand these instructions.  Will watch your condition.  Will get help right away if you are not doing well or get worse. Document Released: 06/30/2005 Document Revised: 09/25/2013 Document Reviewed: 12/26/2013 [facility] Patient Information 2015 [place], [place]. This information is not intended to replace advice given to you by your health care provider. Make sure you discuss any questions you have with your health care provider. Hypertension Hypertension, commonly called high blood pressure, is when the force of blood pumping through your arteries is too strong. Your arteries are the blood vessels that carry blood from your heart throughout your body. A blood pressure reading consists of a higher number over a lower number, such as 110/72. The higher  number (systolic) is the pressure inside your arteries when your heart pumps. The lower number (diastolic) is the pressure inside your arteries when your heart relaxes. Ideally you want your blood pressure below 120/80. Hypertension forces your heart to work harder to pump blood. Your arteries may become narrow or stiff. Having hypertension puts you at risk for heart disease, stroke, and other problems.  RISK FACTORS Some risk factors for high blood pressure are controllable. Others are not.  Risk factors you cannot control include:   Race. You may be at higher risk if you are African American.  Age. Risk increases with age.  Gender. Men are at higher risk than women before age 41 years. After age 1, women are at higher risk than men. Risk factors you can control include:  Not getting enough exercise or physical activity.  Being overweight.  Getting too much fat, sugar, calories, or salt in your diet.  Drinking too much alcohol. SIGNS AND SYMPTOMS Hypertension does not usually cause signs or symptoms. Extremely high blood pressure (hypertensive crisis) may cause headache, anxiety, shortness  of breath, and nosebleed. DIAGNOSIS  To check if you have hypertension, your health care provider will measure your blood pressure while you are seated, with your arm held at the level of your heart. It should be measured at least twice using the same arm. Certain conditions can cause a difference in blood pressure between your right and left arms. A blood pressure reading that is higher than normal on one occasion does not mean that you need treatment. If one blood pressure reading is high, ask your health care provider about having it checked again. TREATMENT  Treating high blood pressure includes making lifestyle changes and possibly taking medicine. Living a healthy lifestyle can help lower high blood pressure. You may need to change some of your habits. Lifestyle changes may include:  Following  the DASH diet. This diet is high in fruits, vegetables, and whole grains. It is low in salt, red meat, and added sugars.  Getting at least 2 hours of brisk physical activity every week.  Losing weight if necessary.  Not smoking.  Limiting alcoholic beverages.  Learning ways to reduce stress. If lifestyle changes are not enough to get your blood pressure under control, your health care provider may prescribe medicine. You may need to take more than one. Work closely with your health care provider to understand the risks and benefits. HOME CARE INSTRUCTIONS  Have your blood pressure rechecked as directed by your health care provider.   Take medicines only as directed by your health care provider. Follow the directions carefully. Blood pressure medicines must be taken as prescribed. The medicine does not work as well when you skip doses. Skipping doses also puts you at risk for problems.   Do not smoke.   Monitor your blood pressure at home as directed by your health care provider. SEEK MEDICAL CARE IF:   You think you are having a reaction to medicines taken.  You have recurrent headaches or feel dizzy.  You have swelling in your ankles.  You have trouble with your vision. SEEK IMMEDIATE MEDICAL CARE IF:  You develop a severe headache or confusion.  You have unusual weakness, numbness, or feel faint.  You have severe chest or abdominal pain.  You vomit repeatedly.  You have trouble breathing. MAKE SURE YOU:   Understand these instructions.  Will watch your condition.  Will get help right away if you are not doing well or get worse. Document Released: 09/20/2005 Document Revised: 02/04/2014 Document Reviewed: 07/13/2013 [facility] Patient Information 2015 [place], [place]. This information is not intended to replace advice given to you by your health care provider. Make sure you discuss any questions you have with your health care provider.

## 2015-01-19 NOTE — ED Provider Notes (Signed)
CSN: [id number]     Arrival date & time 01/19/15  0040 History   First MD Initiated Contact with Patient 01/19/15 0057     Chief Complaint  Patient presents with  . Left arm tingling      (Consider location/radiation/quality/duration/timing/severity/associated sxs/prior Treatment) Patient is a 59 y.o. male presenting with general illness.  Illness Location:  L arm, L side, L leg Quality:  Numbness Severity:  Moderate Onset quality:  Gradual Duration:  3 days Timing:  Intermittent Progression:  Worsening Chronicity:  New Context:  HTN, good compliance but with intermittent spikes Relieved by:  Nothing Worsened by:  Nothing Associated symptoms: no abdominal pain, no chest pain (L sided), no fever, no headaches, no loss of consciousness, no nausea, no shortness of breath and no vomiting   Associated symptoms comment:  Numbness   Past Medical History  Diagnosis Date  . Hypertension   . Thyroid disease     benign cyst removed 15 years ago  . Allergy     seasonal like hayfever    Past Surgical History  Procedure Laterality Date  . Thyroid surgery     Family History  Problem Relation Age of Onset  . Colon cancer Neg Hx   . Rectal cancer Neg Hx   . Stomach cancer Neg Hx   . Lung cancer Father     passed 83  . Diabetes Mother   . Other Mother     [place] disease   History  Substance Use Topics  . Smoking status: Never Smoker   . Smokeless tobacco: Never Used  . Alcohol Use: No     Comment: rare    Review of Systems  Constitutional: Negative for fever.  Respiratory: Negative for shortness of breath.   Cardiovascular: Negative for chest pain (L sided).  Gastrointestinal: Negative for nausea, vomiting and abdominal pain.  Neurological: Negative for loss of consciousness and headaches.  All other systems reviewed and are negative.     Allergies  Review of patient's allergies indicates no known allergies.  Home Medications   Prior to Admission  medications   Medication Sig Start Date End Date Taking? Authorizing Provider  cloNIDine (CATAPRES) 0.2 MG tablet Take 1 tablet (0.2 mg total) by mouth 2 (two) times daily. 12/31/14   Olga Millers, MD  metoprolol succinate (TOPROL-XL) 50 MG 24 hr tablet Take 1 tablet (50 mg total) by mouth daily. 12/31/14   Olga Millers, MD  MOVIPREP 100 G SOLR Take 1 kit (200 g total) by mouth once. moviprep as directed. No substitutions Patient not taking: Reported on 12/31/2014 12/23/14   Irene Shipper, MD   BP 147/70 mmHg  Pulse 56  Temp(Src) 98.5 F (36.9 C) (Oral)  Resp 18  Ht 6' 0.5" (1.842 m)  Wt 235 lb (106.595 kg)  BMI 31.42 kg/m2  SpO2 98% Physical Exam  Constitutional: He is oriented to person, place, and time. He appears well-developed and well-nourished.  HENT:  Head: Normocephalic and atraumatic.  Eyes: Conjunctivae and EOM are normal.  Neck: Normal range of motion. Neck supple.  Cardiovascular: Normal rate, regular rhythm and normal heart sounds.   Pulmonary/Chest: Effort normal and breath sounds normal. No respiratory distress.  Abdominal: He exhibits no distension. There is no tenderness. There is no rebound and no guarding.  Musculoskeletal: Normal range of motion.  Neurological: He is alert and oriented to person, place, and time. He has normal strength and normal reflexes. No cranial nerve deficit or sensory deficit.  GCS eye subscore is 4. GCS verbal subscore is 5. GCS motor subscore is 6.  Skin: Skin is warm and dry.  Vitals reviewed.   ED Course  Procedures (including critical care time) Labs Review Labs Reviewed  CBC WITH DIFFERENTIAL/PLATELET - Abnormal; Notable for the following:    WBC 12.9 (*)    Platelets 752 (*)    Neutro Abs 8.8 (*)    All other components within normal limits  COMPREHENSIVE METABOLIC PANEL - Abnormal; Notable for the following:    Glucose, Bld 103 (*)    BUN 35 (*)    Creatinine, Ser 2.31 (*)    GFR calc non Af Amer 29 (*)    GFR  calc Af Amer 34 (*)    All other components within normal limits  URINALYSIS, ROUTINE W REFLEX MICROSCOPIC  TROPONIN I  TROPONIN I    Imaging Review Dg Chest 2 View  01/19/2015   CLINICAL DATA:  Numbness in the left arm down into the leg, on and off. Hypertension.  EXAM: CHEST  2 VIEW  COMPARISON:  None.  FINDINGS: Generous heart size without definitive cardiomegaly. There is mild to moderate aortic tortuosity for age. There is no edema, consolidation, effusion, or pneumothorax. Surgical clips at the thoracic inlet correlating with history of thyroid surgery.  IMPRESSION: No active cardiopulmonary disease.   Electronically Signed   By: Monte Fantasia M.D.   On: 01/19/2015 01:41   Ct Head Wo Contrast  01/19/2015   CLINICAL DATA:  Left arm and leg numbness on and off today. History of hypertension.  EXAM: CT HEAD WITHOUT CONTRAST  TECHNIQUE: Contiguous axial images were obtained from the base of the skull through the vertex without intravenous contrast.  COMPARISON:  None.  FINDINGS: Skull and Sinuses:Negative for fracture or destructive process. No sinusitis or mastoiditis.  Orbits: No acute abnormality.  Brain: No evidence of acute infarction, hemorrhage, hydrocephalus, or mass lesion/mass effect. Brain volume is normal. No white matter disease.  IMPRESSION: Negative head CT.   Electronically Signed   By: Monte Fantasia M.D.   On: 01/19/2015 01:40     EKG Interpretation None     Unable to import EKG to MUSE  Date: 01/19/2015  Rate: 62  Rhythm: normal sinus rhythm  QRS Axis: normal  Intervals: normal  ST/T Wave abnormalities: normal  Conduction Disutrbances: none  Narrative Interpretation: unremarkable EKG, no change from prior     MDM   Final diagnoses:  Essential hypertension  Numbness    59 y.o. male with pertinent PMH of HTN presents with intermittent L sided numbness over the last few weeks, progressively worsening.  He has been seen in the ED and by PCP for HTN, with  progressive changes in BP medications.  He states his symptoms are intermittently in arm and leg and flank, however never in all locations at the same time, generally lasting <1 min.  No pain.  On arrival vitals and physical exam as above.  No focal neuro deficits, and pt asymptomatic at time of my exam.  Given labetalol for BP control.  Wu as above with elevated cr, mildly elevated compared to prior.  CT, CXR, delta trop unremarkable.  Trop obtained due to L shoulder and arm radiation symptoms to ro ACS.    Consider symptoms likely hypertensive urgency, however discussed TIA possibility at length with pt.  He is low risk ABCD2 score (3), and I have instructed him to begin taking ASA daily.  I also informed him  to present immediately if his symptoms began to persist.  I gave him neurology fu.  DC home in stable condition  I have reviewed all laboratory and imaging studies if ordered as above  1. Essential hypertension   2. Numbness         Debby Freiberg, MD 01/19/15 [phone]

## 2015-01-19 NOTE — ED Notes (Signed)
Pt states his left arm and down into left leg had numbness.

## 2015-01-21 ENCOUNTER — Ambulatory Visit
Admission: RE | Admit: 2015-01-21 | Discharge: 2015-01-21 | Disposition: A | Discharge status: Home / Self Care | Payer: BLUE CROSS/BLUE SHIELD | Source: Ambulatory Visit | Attending: Internal Medicine | Admitting: Internal Medicine

## 2015-01-21 DIAGNOSIS — N289 Disorder of kidney and ureter, unspecified: Secondary | ICD-10-CM

## 2015-01-23 ENCOUNTER — Other Ambulatory Visit (INDEPENDENT_AMBULATORY_CARE_PROVIDER_SITE_OTHER): Payer: BLUE CROSS/BLUE SHIELD

## 2015-01-23 ENCOUNTER — Ambulatory Visit (INDEPENDENT_AMBULATORY_CARE_PROVIDER_SITE_OTHER): Payer: BLUE CROSS/BLUE SHIELD | Admitting: Internal Medicine

## 2015-01-23 ENCOUNTER — Encounter: Payer: Self-pay | Admitting: Internal Medicine

## 2015-01-23 VITALS — BP 138/82 | HR 64 | Temp 98.2°F | Resp 16 | Ht 72.05 in | Wt 246.0 lb

## 2015-01-23 DIAGNOSIS — N183 Chronic kidney disease, stage 3 (moderate): Secondary | ICD-10-CM | POA: Diagnosis not present

## 2015-01-23 DIAGNOSIS — R200 Anesthesia of skin: Secondary | ICD-10-CM | POA: Insufficient documentation

## 2015-01-23 DIAGNOSIS — I10 Essential (primary) hypertension: Secondary | ICD-10-CM | POA: Diagnosis not present

## 2015-01-23 DIAGNOSIS — N179 Acute kidney failure, unspecified: Secondary | ICD-10-CM | POA: Diagnosis not present

## 2015-01-23 LAB — RENAL FUNCTION PANEL
Albumin: 4.2 g/dL (ref 3.5–5.2)
BUN: 35 mg/dL — AB (ref 6–23)
CALCIUM: 9 mg/dL (ref 8.4–10.5)
CO2: 27 meq/L (ref 19–32)
CREATININE: 2.3 mg/dL — AB (ref 0.40–1.50)
Chloride: 108 mEq/L (ref 96–112)
GFR: 31.01 mL/min — ABNORMAL LOW (ref >60.00)
Glucose, Bld: 83 mg/dL (ref 70–99)
Phosphorus: 3.5 mg/dL (ref 2.3–4.6)
Potassium: 4.4 mEq/L (ref 3.5–5.1)
Sodium: 140 mEq/L (ref 135–145)

## 2015-01-23 NOTE — Assessment & Plan Note (Signed)
BP controlled, recheck renal panel and refer to nephrology. He has had consistently elevated creatinine (concern for chronic damage versus reversible process). Refer to nephrology to see if kidney biopsy is indicated to look for reversible cause.

## 2015-01-23 NOTE — Assessment & Plan Note (Signed)
He has not had recurrence since leaving ER and is taking ASA 81 mg daily. The episode with his side is likely from exertion but concern over the episode with his left arm. EKG without changes in the ER as well as CT head. He has past injury in the neck and this could be causing some intermittent nerve compression (per his reports has had several episodes and still not good feeling in his 1-3 fingers in left hand all the time). Continue ASA 81 mg daily and referral to neurology.

## 2015-01-23 NOTE — Progress Notes (Signed)
   Subjective:    Patient ID: Howard Green, male    DOB: 1956/06/19, 59 y.o.   MRN: [id number]  HPI The patient is a 60 YO man who is coming in for blood pressure follow up but has been to the ER since last visit (reviewed notes, arm numbness with CT negative for stroke and symptoms resolves advised to follow up with neurology). He had the initial episode of numbness when doing physical exertion. He has been taking ASA 81 mg daily and no recurrence. He is still taking his blood pressure medicine and BP has been controlled at home. No headaches, chest pains, SOB. He does great exertion at his home and no problems with that.   Review of Systems  Constitutional: Negative for fever, chills, activity change, appetite change, fatigue and unexpected weight change.  HENT: Negative.   Eyes: Negative.   Respiratory: Negative for cough, chest tightness, shortness of breath and wheezing.   Cardiovascular: Negative for chest pain, palpitations and leg swelling.  Gastrointestinal: Negative for nausea, abdominal pain, diarrhea and abdominal distention.  Genitourinary: Negative.   Musculoskeletal: Negative.   Skin: Negative.   Neurological: Positive for numbness. Negative for dizziness and weakness.       Resolved now  Psychiatric/Behavioral: Negative.       Objective:   Physical Exam  Constitutional: He is oriented to person, place, and time. He appears well-developed and well-nourished.  HENT:  Head: Normocephalic and atraumatic.  Eyes: EOM are normal.  Neck: Normal range of motion.  Cardiovascular: Normal rate and regular rhythm.   No murmur heard. Pulmonary/Chest: Effort normal and breath sounds normal.  Abdominal: Soft. Bowel sounds are normal. He exhibits no distension. There is no tenderness. There is no rebound.  Musculoskeletal: He exhibits no edema.  Neurological: He is alert and oriented to person, place, and time. Coordination normal.  Skin: Skin is warm and dry.   Filed Vitals:     01/23/15 1445  BP: 138/82  Pulse: 64  Temp: 98.2 F (36.8 C)  TempSrc: Oral  Resp: 16  Height: 6' 0.05" (1.83 m)  Weight: 246 lb (111.585 kg)  SpO2: 98%      Assessment & Plan:

## 2015-01-23 NOTE — Patient Instructions (Signed)
We will recheck the kidney function today and if it still not any better then we will send you to the kidney doctor.   We will also have you keep taking the aspirin 81 mg daily for prevention. We will send in the referral for the neurologist for the arm.   Come back in about 2 months to see how you are doing.

## 2015-01-23 NOTE — Assessment & Plan Note (Signed)
Unclear if this is true AKI and may be more CKD stage 3/4 (depending on which labs you look at). Recheck levels today and referring to nephrology.

## 2015-02-16 ENCOUNTER — Other Ambulatory Visit: Payer: Self-pay | Admitting: Internal Medicine

## 2015-02-28 ENCOUNTER — Encounter: Payer: Self-pay | Admitting: Neurology

## 2015-02-28 ENCOUNTER — Ambulatory Visit (INDEPENDENT_AMBULATORY_CARE_PROVIDER_SITE_OTHER): Payer: BLUE CROSS/BLUE SHIELD | Admitting: Neurology

## 2015-02-28 ENCOUNTER — Telehealth: Payer: Self-pay | Admitting: *Deleted

## 2015-02-28 ENCOUNTER — Other Ambulatory Visit: Payer: Self-pay | Admitting: *Deleted

## 2015-02-28 VITALS — BP 140/84 | HR 92 | Ht 72.0 in | Wt 234.6 lb

## 2015-02-28 DIAGNOSIS — R29818 Other symptoms and signs involving the nervous system: Secondary | ICD-10-CM | POA: Diagnosis not present

## 2015-02-28 DIAGNOSIS — M5412 Radiculopathy, cervical region: Secondary | ICD-10-CM

## 2015-02-28 DIAGNOSIS — M542 Cervicalgia: Secondary | ICD-10-CM | POA: Diagnosis not present

## 2015-02-28 DIAGNOSIS — R202 Paresthesia of skin: Secondary | ICD-10-CM

## 2015-02-28 NOTE — Progress Notes (Signed)
[facility] Clinic Note - Initial Visit   Date: 02/28/2015   Howard Green MRN: [id number] DOB: 03/21/1956   Dear Dr. Shelda Altes:  Thank you for your kind referral of Flavio Lindroth for consultation of left arm paresthesias. Although his history is well known to you, please allow [place] to reiterate it for the purpose of our medical record. The patient was accompanied to the clinic by self.    History of Present Illness: Howard Green is a 59 y.o. right-handed Caucasian male with hypertension presenting for evaluation of left arm pain.  He had noticed left shoulder and arm pain for the past several years, which first started when he was holding his phone on the left hand, when he recalls having transient tingling of the left hand, lasting 30-minutes.  Since then, he has had intermittent spells of the entire arm falling asleep, again lasting 20-30 seconds.  He has difficulty grasping objects.  On one occasional, he was lifting something above his shoulder and developed burning sensation over his left shoulder and numbness over the first three fingers.  He has noticed that certain head and neck movements can trigger these symptoms.  Sometimes, symptoms are worsen after physical activity.  Nothing alleviates it.  There is no associated pain.    On April 17th, he developed left rib cage numbness which prompted him to go to the emergency department with CT head returned normal. He has been diagnosed with hypertension and is concerned whether this may be related to his left arm symptoms.    His leg and face has never been involved. No prior history of TIA/stroke.    Out-side paper records, electronic medical record, and images have been reviewed where available and summarized as:  CT head non contrast 01/19/2015:  Normal  Lab Results  Component Value Date   CHOL 137 01/14/2015   HDL 25.40* 01/14/2015   LDLCALC 84 01/14/2015   TRIG 139.0 01/14/2015   CHOLHDL 5 01/14/2015     Past Medical History  Diagnosis Date  . Hypertension   . Thyroid disease     benign cyst removed 15 years ago  . Allergy     seasonal like hayfever     Past Surgical History  Procedure Laterality Date  . Thyroid surgery       Medications:  Current Outpatient Prescriptions on File Prior to Visit  Medication Sig Dispense Refill  . metoprolol succinate (TOPROL-XL) 50 MG 24 hr tablet TAKE 1 TABLET BY MOUTH EVERY DAY 30 tablet 0   No current facility-administered medications on file prior to visit.    Allergies: No Known Allergies  Family History: Family History  Problem Relation Age of Onset  . Colon cancer Neg Hx   . Rectal cancer Neg Hx   . Stomach cancer Neg Hx   . Lung cancer Father     passed 66  . Diabetes Mother   . Other Mother     Good Pastures disease    Social History: History   Social History  . Marital Status: Married    Spouse Name: N/A  . Number of Children: N/A  . Years of Education: N/A   Occupational History  . Not on file.   Social History Main Topics  . Smoking status: Never Smoker   . Smokeless tobacco: Never Used  . Alcohol Use: No     Comment: rare  . Drug Use: No  . Sexual Activity: Not on file   Other Topics Concern  .  Not on file   Social History Narrative   Lives with wife in a one story home.  Has 4 children.  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       Review of Systems:  CONSTITUTIONAL: No fevers, chills, night sweats, or weight loss.   EYES: No visual changes or eye pain ENT: No hearing changes.  No history of nose bleeds.   RESPIRATORY: No cough, wheezing and shortness of breath.   CARDIOVASCULAR: Negative for chest pain, and palpitations.   GI: Negative for abdominal discomfort, blood in stools or black stools.  No recent change in bowel habits.   GU:  No history of incontinence.   MUSCLOSKELETAL: No history of joint pain or swelling.  No myalgias.   SKIN: Negative for  lesions, rash, and itching.   HEMATOLOGY/ONCOLOGY: Negative for prolonged bleeding, bruising easily, and swollen nodes.  No history of cancer.   ENDOCRINE: Negative for cold or heat intolerance, polydipsia or goiter.   PSYCH:  No depression or anxiety symptoms.   NEURO: As Above.   Vital Signs:  BP 140/84 mmHg  Pulse 92  Ht 6' (1.829 m)  Wt 234 lb 9 oz (106.397 kg)  BMI 31.81 kg/m2  SpO2 93% Pain Scale: 0 on a scale of 0-10   General Medical Exam:   General:  Well appearing, comfortable.   Eyes/ENT: see cranial nerve examination.   Neck: No masses appreciated.  Full range of motion without tenderness.  No carotid bruits. Respiratory:  Clear to auscultation, good air entry bilaterally.   Cardiac:  Regular rate and rhythm, no murmur.   Extremities:  No deformities, edema, or skin discoloration.  Skin:  No rashes or lesions.  Neurological Exam: MENTAL STATUS including orientation to time, place, person, recent and remote memory, attention span and concentration, language, and fund of knowledge is normal.  Speech is not dysarthric.  CRANIAL NERVES: II:  No visual field defects.  Unremarkable fundi.   III-IV-VI: Pupils equal round and reactive to light.  Normal conjugate, extra-ocular eye movements in all directions of gaze.  No nystagmus.  No ptosis.   V:  Normal facial sensation.     VII:  Normal facial symmetry and movements.  No pathologic facial reflexes.  VIII:  Normal hearing and vestibular function.   IX-X:  Normal palatal movement.   XI:  Normal shoulder shrug and head rotation.   XII:  Normal tongue strength and range of motion, no deviation or fasciculation.  MOTOR:  No atrophy, fasciculations or abnormal movements.  No pronator drift.  Tone is normal.    Right Upper Extremity:    Left Upper Extremity:    Deltoid  5/5   Deltoid  5/5   Biceps  5/5   Biceps  5/5   Triceps  5/5   Triceps  5/5   Wrist extensors  5/5   Wrist extensors  5/5   Wrist flexors  5/5   Wrist  flexors  5/5   Finger extensors  5/5   Finger extensors  5/5   Finger flexors  5/5   Finger flexors  5/5   Dorsal interossei  5/5   Dorsal interossei  5/5   Abductor pollicis  5/5   Abductor pollicis  5/5   Tone (Ashworth scale)  0  Tone (Ashworth scale)  0   Right Lower Extremity:    Left Lower Extremity:    Hip flexors  5/5   Hip flexors  5/5   Hip  extensors  5/5   Hip extensors  5/5   Knee flexors  5/5   Knee flexors  5/5   Knee extensors  5/5   Knee extensors  5/5   Dorsiflexors  5/5   Dorsiflexors  5/5   Plantarflexors  5/5   Plantarflexors  5/5   Toe extensors  5/5   Toe extensors  5/5   Toe flexors  5/5   Toe flexors  5/5   Tone (Ashworth scale)  0  Tone (Ashworth scale)  0   MSRs:  Right                                                                 Left brachioradialis 2+  brachioradialis 2+  biceps 2+  biceps 2+  triceps 2+  triceps 2+  patellar 2+  patellar 2+  ankle jerk 2+  ankle jerk 2+  Hoffman no  Hoffman no  plantar response down  plantar response down   SENSORY:  Normal and symmetric perception of light touch, pinprick, vibration, and proprioception.  Romberg's sign absent.   COORDINATION/GAIT: Normal finger-to- nose-finger and heel-to-shin.  Intact rapid alternating movements bilaterally.  Able to rise from a chair without using arms.  Gait mildly antalgic due to left ankle pain (gout).   IMPRESSION: Mr. Basu is a 59 year-old gentleman with hypertension presenting for evaluation of episodic left arm paresthesias.  His exam is non-focal.  Previous work-up has included CT head which was normal.  No prior vessel or cervical imaging. He most likely has cervical radiculopathy especially since symptoms can be triggered or worsened by neck movement, however this would not manifest with thoracic paresthesias, so TIA also needs to be considered.  Stroke risk factors:  Male, age, hypertension.  No history of diabetes and last LDL is within normal limits at  84.   PLAN/RECOMMENDATIONS:  MRI neck without contast US carotids Continue aspirin and statin therapy Return to clinic in 3 months.   The duration of this appointment visit was 35 minutes of face-to-face time with the patient.  Greater than 50% of this time was spent in counseling, explanation of diagnosis, planning of further management, and coordination of care.   Thank you for allowing me to participate in patient's care.  If I can answer any additional questions, I would be pleased to do so.    Sincerely,    Donika K. Posey Pronto, DO

## 2015-02-28 NOTE — Progress Notes (Signed)
Note sent

## 2015-02-28 NOTE — Telephone Encounter (Signed)
PA # [id number] Valid x 30 days

## 2015-02-28 NOTE — Patient Instructions (Addendum)
MRI neck without contast US carotids Return to clinic in 2-3 months

## 2015-03-04 ENCOUNTER — Ambulatory Visit (HOSPITAL_COMMUNITY): Payer: BLUE CROSS/BLUE SHIELD | Attending: Neurology

## 2015-03-04 DIAGNOSIS — R209 Unspecified disturbances of skin sensation: Secondary | ICD-10-CM | POA: Diagnosis not present

## 2015-03-04 DIAGNOSIS — M5412 Radiculopathy, cervical region: Secondary | ICD-10-CM | POA: Diagnosis not present

## 2015-03-04 DIAGNOSIS — R29818 Other symptoms and signs involving the nervous system: Secondary | ICD-10-CM

## 2015-03-04 DIAGNOSIS — R202 Paresthesia of skin: Secondary | ICD-10-CM

## 2015-03-04 DIAGNOSIS — M542 Cervicalgia: Secondary | ICD-10-CM | POA: Insufficient documentation

## 2015-03-05 ENCOUNTER — Telehealth: Payer: Self-pay | Admitting: Internal Medicine

## 2015-03-05 NOTE — Telephone Encounter (Signed)
[facility] (Dr Joelyn Oms) is advising that they would like an EKG ordered ASAP. Please call patient to advise. He did have an EKG at [facility] if this will work.

## 2015-03-06 ENCOUNTER — Telehealth: Payer: Self-pay | Admitting: Neurology

## 2015-03-06 NOTE — Telephone Encounter (Signed)
i called and talked with patient---he has not had any episode with his heart, he is saying dr sanford is requesting a recent ekg be sent to [facility]---i called and left message with dr sanfords assistant advising that patient has appt on 6/14 and that we could do an ekg on that date and have ekg sent/faxed to [facility] at that time---if this is not ok and they need an ekg before then, i advised that [facility] needs to call me back so that i can have patient come to our office before he sees dr Doug Sou on 6/14 to arrange for ekg sooner than 6/14---if [facility]/dr sanford's office calls back, i need to know if 6/14 date for ekg is ok

## 2015-03-06 NOTE — Telephone Encounter (Signed)
MRI cervical spine wo contrast 03/06/2015: 1.  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 2.  Moderate to severe bilateral foraminal stenosis at C6-7. 3.  Moderate right foraminal stenosis at C4-5 4.  Multiple nodules within the thyroid isthmus and left thyroid lobe, recommend dedicated thyroid ultrasound  Results of US carotids (no significant stenosis) as well as MRI as noted above were discussed with the patient.  His left arm paresthesias are most likely caused by disc herniation at this level.  Proceed with occupational therapy and neck PT.  I also will request that his PCP further evaluation the changes noted on his thyroid with US thyroid.  Myana Schlup K. Posey Pronto, DO

## 2015-03-06 NOTE — Telephone Encounter (Signed)
Noted  

## 2015-03-06 NOTE — Telephone Encounter (Signed)
PT order sent

## 2015-03-18 ENCOUNTER — Ambulatory Visit: Payer: BLUE CROSS/BLUE SHIELD | Admitting: Internal Medicine

## 2015-03-21 ENCOUNTER — Other Ambulatory Visit (INDEPENDENT_AMBULATORY_CARE_PROVIDER_SITE_OTHER): Payer: BLUE CROSS/BLUE SHIELD

## 2015-03-21 ENCOUNTER — Ambulatory Visit (INDEPENDENT_AMBULATORY_CARE_PROVIDER_SITE_OTHER): Payer: BLUE CROSS/BLUE SHIELD | Admitting: Internal Medicine

## 2015-03-21 ENCOUNTER — Encounter: Payer: Self-pay | Admitting: Internal Medicine

## 2015-03-21 VITALS — BP 138/78 | HR 85 | Temp 98.7°F | Resp 16 | Ht 73.0 in | Wt 231.0 lb

## 2015-03-21 DIAGNOSIS — Z23 Encounter for immunization: Secondary | ICD-10-CM

## 2015-03-21 DIAGNOSIS — M10271 Drug-induced gout, right ankle and foot: Secondary | ICD-10-CM

## 2015-03-21 DIAGNOSIS — M109 Gout, unspecified: Secondary | ICD-10-CM | POA: Insufficient documentation

## 2015-03-21 DIAGNOSIS — E041 Nontoxic single thyroid nodule: Secondary | ICD-10-CM | POA: Diagnosis not present

## 2015-03-21 DIAGNOSIS — I10 Essential (primary) hypertension: Secondary | ICD-10-CM

## 2015-03-21 LAB — TSH: TSH: 4.37 u[IU]/mL (ref 0.35–4.50)

## 2015-03-21 LAB — T4, FREE: Free T4: 0.67 ng/dL (ref 0.60–1.60)

## 2015-03-21 MED ORDER — COLCHICINE 0.6 MG PO TABS: 0.6000 mg | ORAL_TABLET | Freq: Every day | ORAL | Status: DC

## 2015-03-21 NOTE — Progress Notes (Signed)
   Subjective:    Patient ID: Howard Green, male    DOB: 1956/06/26, 59 y.o.   MRN: [id number]  HPI The patient is a 59 YO man coming back in for follow up of some abnormal labs and findings. He saw his nephrologist who found his TSH to be high. He is having some low energy and constipation. Has had partial thyroidectomy in the past due to nodules. Also he had some nodules found on MRI neck (done by neurology for arm numbness and pain) and recommended US thyroid to characterize. Denies problems swallowing or neck size change.   Also having new problem of his chlorthalidone causing gout flare. Happened about 3 weeks ago in both ankles. Did not take anything for it but stopped the chlorthalidone. Did not call anyone and has not sought care for it. Taking tylenol for pain. Now improving but still some stiffness in his ankles. Has had gout in the past and used to be on uloric. Has not had flare in some time.   Review of Systems  Constitutional: Positive for activity change. Negative for fever, appetite change, fatigue and unexpected weight change.  Respiratory: Negative for cough, chest tightness, shortness of breath and wheezing.   Cardiovascular: Negative for chest pain, palpitations and leg swelling.  Gastrointestinal: Positive for constipation. Negative for nausea, abdominal pain, diarrhea and abdominal distention.  Musculoskeletal: Positive for arthralgias. Negative for myalgias and back pain.  Skin: Negative.   Neurological: Positive for numbness.       Improved in left arm with PT      Objective:   Physical Exam  Constitutional: He is oriented to person, place, and time. He appears well-developed and well-nourished.  HENT:  Head: Normocephalic and atraumatic.  Eyes: EOM are normal.  Neck: Normal range of motion.  Cardiovascular: Normal rate and regular rhythm.   No murmur heard. Pulmonary/Chest: Effort normal and breath sounds normal.  Abdominal: Soft. Bowel sounds are normal. He  exhibits no distension. There is no tenderness. There is no rebound.  Musculoskeletal: He exhibits no edema.  Neurological: He is alert and oriented to person, place, and time. Coordination normal.  Skin: Skin is warm and dry.   Filed Vitals:   03/21/15 0919  BP: 158/82  Pulse: 85  Temp: 98.7 F (37.1 C)  TempSrc: Oral  Resp: 16  Height: 6\' 1"  (1.854 m)  Weight: 231 lb (104.781 kg)  SpO2: 95%      Assessment & Plan:

## 2015-03-21 NOTE — Assessment & Plan Note (Signed)
Off chlorthalidone due to gout flare and advised not to resume. BP okay on amlodipine and toprol-xl. Continue to follow with nephrology.

## 2015-03-21 NOTE — Progress Notes (Signed)
Pre visit review using our clinic review tool, if applicable. No additional management support is needed unless otherwise documented below in the visit note. 

## 2015-03-21 NOTE — Assessment & Plan Note (Signed)
TSH 11 from nephrology and he has had partial thyroidectomy for nodules in the past. MRI found nodules and getting US soft tissue neck to characterize nodules. Checking TSH and free T4 to see if he has deficiency. Advised him that likely he will need to start on synthroid (he was familiar with) and return for labs to adjust dosing in about 6-8 weeks.

## 2015-03-21 NOTE — Assessment & Plan Note (Signed)
Has had gout flares in the past and was on uloric. This event precipitated by chlorthalidone. He has stopped and I have advised him to not resume. Will check uric acid in about 6 weeks to see if elevated. If so can try allopurinol for control of flares. Colchicine sent in to take 0.6 mg daily as needed for pain.

## 2015-03-21 NOTE — Patient Instructions (Signed)
We are going to check blood work today for the thyroid and will likely need to start you on thyroid medicine. We will also check an ultrasound of the neck to make sure there are no thyroid nodules that we need to check out.   Stay off the chlorthalidone. We have sent in colchicine which is a medicine for gout when you have a flare that you can take once a day for pain and stiffness.

## 2015-04-02 ENCOUNTER — Other Ambulatory Visit: Payer: Self-pay | Admitting: Internal Medicine

## 2015-04-03 ENCOUNTER — Ambulatory Visit
Admission: RE | Admit: 2015-04-03 | Discharge: 2015-04-03 | Disposition: A | Discharge status: Home / Self Care | Payer: BLUE CROSS/BLUE SHIELD | Source: Ambulatory Visit | Attending: Internal Medicine | Admitting: Internal Medicine

## 2015-04-03 DIAGNOSIS — E041 Nontoxic single thyroid nodule: Secondary | ICD-10-CM

## 2015-04-15 ENCOUNTER — Encounter: Payer: Self-pay | Admitting: Neurology

## 2015-05-01 ENCOUNTER — Other Ambulatory Visit: Payer: Self-pay | Admitting: Internal Medicine

## 2015-05-05 ENCOUNTER — Ambulatory Visit: Payer: BLUE CROSS/BLUE SHIELD | Admitting: Neurology

## 2015-05-06 ENCOUNTER — Encounter: Payer: Self-pay | Admitting: *Deleted

## 2015-05-30 ENCOUNTER — Other Ambulatory Visit: Payer: Self-pay | Admitting: Internal Medicine

## 2015-06-23 ENCOUNTER — Ambulatory Visit (INDEPENDENT_AMBULATORY_CARE_PROVIDER_SITE_OTHER): Payer: BLUE CROSS/BLUE SHIELD | Admitting: Internal Medicine

## 2015-06-23 ENCOUNTER — Encounter: Payer: Self-pay | Admitting: Internal Medicine

## 2015-06-23 VITALS — BP 170/72 | HR 65 | Temp 98.0°F | Resp 12 | Ht 73.0 in | Wt 240.1 lb

## 2015-06-23 DIAGNOSIS — M10271 Drug-induced gout, right ankle and foot: Secondary | ICD-10-CM

## 2015-06-23 DIAGNOSIS — E041 Nontoxic single thyroid nodule: Secondary | ICD-10-CM

## 2015-06-23 DIAGNOSIS — I10 Essential (primary) hypertension: Secondary | ICD-10-CM | POA: Diagnosis not present

## 2015-06-23 NOTE — Progress Notes (Signed)
Pre visit review using our clinic review tool, if applicable. No additional management support is needed unless otherwise documented below in the visit note. 

## 2015-06-23 NOTE — Patient Instructions (Signed)
We are not changing any of the medicines today and not checking any blood work.   Come back in about 6 months and sooner if you have any problems or questions.

## 2015-06-24 NOTE — Assessment & Plan Note (Signed)
BP elevated moderately today and likely related to his wife's traumatic passing recently. Last BP at goal on same regimen. See him back and adjust as needed. For now continue amlodipine and metoprolol.

## 2015-06-24 NOTE — Progress Notes (Signed)
   Subjective:    Patient ID: Howard Green, male    DOB: 1956-02-13, 59 y.o.   MRN: [id number]  HPI The patient is a 59 YO man who is coming in for follow up of his gout and blood pressure. Since stopping the chlorthalidone his gout is gone now. He has not had any pain and is no longer using the colchicine. His wife passed unexpectedly last week and so his pressures have been up the last 1-2 weeks. He is still struggling to adjust. Taking his medicines as directed without side effects. No headaches or chest pains. Also his son-in-law was in the ICU after a cath for several days in the last 2 weeks. No new concerns. 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   Review of Systems  Constitutional: Positive for activity change. Negative for fever, appetite change, fatigue and unexpected weight change.  Respiratory: Negative for cough, chest tightness, shortness of breath and wheezing.   Cardiovascular: Negative for chest pain, palpitations and leg swelling.  Gastrointestinal: Negative for nausea, abdominal pain, diarrhea, constipation and abdominal distention.  Musculoskeletal: Positive for arthralgias. Negative for myalgias and back pain.  Skin: Negative.   Psychiatric/Behavioral: Positive for sleep disturbance and dysphoric mood. Negative for suicidal ideas and self-injury. The patient is nervous/anxious.       Objective:   Physical Exam Filed Vitals:   06/23/15 1421 06/23/15 1510  BP: 154/82 170/72  Pulse: 65   Temp: 98 F (36.7 C)   TempSrc: Oral   Resp: 12   Height: 6\' 1"  (1.854 m)   Weight: 240 lb 1.9 oz (108.918 kg)   SpO2: 98%      Assessment & Plan:

## 2015-06-24 NOTE — Assessment & Plan Note (Signed)
No recurrence since stopping chlorthalidone. Likely does not need daily treatment but does need to remain off thiazide diuretics.

## 2015-06-24 NOTE — Assessment & Plan Note (Signed)
Previous labs with low normal T4 and he elected not to start treatment. Will continue to monitor every 6 months.

## 2015-06-30 ENCOUNTER — Other Ambulatory Visit: Payer: Self-pay | Admitting: Internal Medicine

## 2015-07-31 ENCOUNTER — Other Ambulatory Visit: Payer: Self-pay | Admitting: Internal Medicine

## 2015-09-03 ENCOUNTER — Other Ambulatory Visit: Payer: Self-pay | Admitting: Internal Medicine

## 2015-10-01 ENCOUNTER — Other Ambulatory Visit: Payer: Self-pay | Admitting: Internal Medicine

## 2015-11-02 ENCOUNTER — Other Ambulatory Visit: Payer: Self-pay | Admitting: Internal Medicine

## 2016-03-02 ENCOUNTER — Encounter (HOSPITAL_BASED_OUTPATIENT_CLINIC_OR_DEPARTMENT_OTHER): Payer: Self-pay

## 2016-03-02 ENCOUNTER — Emergency Department (HOSPITAL_BASED_OUTPATIENT_CLINIC_OR_DEPARTMENT_OTHER)
Admission: EM | Admit: 2016-03-02 | Discharge: 2016-03-02 | Disposition: A | Discharge status: Home / Self Care | Payer: BLUE CROSS/BLUE SHIELD | Attending: Emergency Medicine | Admitting: Emergency Medicine

## 2016-03-02 ENCOUNTER — Emergency Department (HOSPITAL_BASED_OUTPATIENT_CLINIC_OR_DEPARTMENT_OTHER): Payer: BLUE CROSS/BLUE SHIELD

## 2016-03-02 DIAGNOSIS — Y9389 Activity, other specified: Secondary | ICD-10-CM | POA: Insufficient documentation

## 2016-03-02 DIAGNOSIS — I10 Essential (primary) hypertension: Secondary | ICD-10-CM | POA: Diagnosis not present

## 2016-03-02 DIAGNOSIS — S20211A Contusion of right front wall of thorax, initial encounter: Secondary | ICD-10-CM | POA: Diagnosis not present

## 2016-03-02 DIAGNOSIS — Z79899 Other long term (current) drug therapy: Secondary | ICD-10-CM | POA: Insufficient documentation

## 2016-03-02 DIAGNOSIS — E079 Disorder of thyroid, unspecified: Secondary | ICD-10-CM | POA: Insufficient documentation

## 2016-03-02 DIAGNOSIS — Y929 Unspecified place or not applicable: Secondary | ICD-10-CM | POA: Insufficient documentation

## 2016-03-02 DIAGNOSIS — Y999 Unspecified external cause status: Secondary | ICD-10-CM | POA: Diagnosis not present

## 2016-03-02 DIAGNOSIS — S299XXA Unspecified injury of thorax, initial encounter: Secondary | ICD-10-CM | POA: Diagnosis present

## 2016-03-02 MED ORDER — TRAMADOL HCL 50 MG PO TABS: 50.0000 mg | ORAL_TABLET | Freq: Four times a day (QID) | ORAL | Status: DC | PRN

## 2016-03-02 NOTE — Discharge Instructions (Signed)
Chest Contusion °A contusion is a deep bruise. Bruises happen when an injury causes bleeding under the skin. Signs of bruising include pain, puffiness (swelling), and discolored skin. The bruise may turn blue, purple, or yellow.  °HOME CARE °· Put ice on the injured area. °¨ Put ice in a plastic bag. °¨ Place a towel between the skin and the bag. °¨ Leave the ice on for 15-20 minutes at a time, 03-04 times a day for the first 48 hours. °· Only take medicine as told by your doctor. °· Rest. °· Take deep breaths (deep-breathing exercises) as told by your doctor. °· Stop smoking if you smoke. °· Do not lift objects over 5 pounds (2.3 kilograms) for 3 days or longer if told by your doctor. °GET HELP RIGHT AWAY IF:  °· You have more bruising or puffiness. °· You have pain that gets worse. °· You have trouble breathing. °· You are dizzy, weak, or pass out (faint). °· You have blood in your pee (urine) or poop (stool). °· You cough up or throw up (vomit) blood. °· Your puffiness or pain is not helped with medicines. °MAKE SURE YOU:  °· Understand these instructions. °· Will watch your condition. °· Will get help right away if you are not doing well or get worse. °  °This information is not intended to replace advice given to you by your health care provider. Make sure you discuss any questions you have with your health care provider. °  °Document Released: 03/08/2008 Document Revised: 06/14/2012 Document Reviewed: 03/13/2012 °Elsevier Interactive Patient Education ©2016 [employer] ° °

## 2016-03-02 NOTE — ED Provider Notes (Signed)
CSN: [id number]     Arrival date & time 03/02/16  2000 History   By signing my name below, I, Howard Green, attest that this documentation has been prepared under the direction and in the presence of [name], MD.  Electronically Signed: Tedra Coupe. Sheppard Coil, ED Scribe. 03/02/2016. 8:19 PM.    Chief Complaint  Patient presents with  . Rib Injury    The history is provided by the patient. No language interpreter was used.    HPI Comments: Howard Green is a 60 y.o. male who presents to the Emergency Department s/p fall that occurred PTA. Pt reports that he fell after unsaddlng his horse, which caused the animal to "get spooked" and push him backwards onto the ground. He states that the animal did not cause any physical harm to his body. No LOC. Pt complains of impact pain on right arm and shoulder, and pain in upper right ribs. There are no modifying factors. He denies any nausea, vomiting, shortness of breath, pain to bilateral lower extremities, or head/neck injury.  Past Medical History  Diagnosis Date  . Hypertension   . Thyroid disease     benign cyst removed 15 years ago  . Allergy     seasonal like hayfever    Past Surgical History  Procedure Laterality Date  . Thyroid surgery     Family History  Problem Relation Age of Onset  . Colon cancer Neg Hx   . Rectal cancer Neg Hx   . Stomach cancer Neg Hx   . Lung cancer Father     passed 6  . Diabetes Mother   . Other Mother     Good Pastures disease  . Healthy Brother   . Healthy Sister   . Healthy Son   . Healthy Daughter    Social History  Substance Use Topics  . Smoking status: Never Smoker   . Smokeless tobacco: Never Used  . Alcohol Use: No    Review of Systems  Constitutional: Negative for fever.  Respiratory: Negative for shortness of breath.   Cardiovascular: Negative for chest pain.  Gastrointestinal: Negative for nausea and vomiting.  Musculoskeletal: Positive for myalgias and  arthralgias. Negative for neck pain.  All other systems reviewed and are negative.   Allergies  Review of patient's allergies indicates no known allergies.  Home Medications   Prior to Admission medications   Medication Sig Start Date End Date Taking? Authorizing Provider  torsemide (DEMADEX) 20 MG tablet Take 20 mg by mouth daily.   Yes Historical Provider, MD  amLODipine (NORVASC) 10 MG tablet Take 10 mg by mouth daily.  02/19/15   Historical Provider, MD  aspirin 81 MG tablet Take 81 mg by mouth daily.    Historical Provider, MD  metoprolol succinate (TOPROL-XL) 50 MG 24 hr tablet TAKE 1 TABLET BY MOUTH DAILY 11/03/15   [name], MD  traMADol (ULTRAM) 50 MG tablet Take 1 tablet (50 mg total) by mouth every 6 (six) hours as needed. 03/02/16   [name], MD   BP 164/88 mmHg  Pulse 96  Temp(Src) 98.8 F (37.1 C) (Oral)  Resp 18  Ht 6\' 1"  (1.854 m)  Wt 245 lb (111.131 kg)  BMI 32.33 kg/m2  SpO2 95% Physical Exam  Constitutional: He is oriented to person, place, and time. He appears well-developed and well-nourished.  HENT:  Head: Normocephalic and atraumatic.  Eyes: EOM are normal. Pupils are equal, round, and reactive to light.  Neck: Normal range  of motion.  Cardiovascular: Normal rate, regular rhythm and normal heart sounds.   Pulmonary/Chest: Effort normal and breath sounds normal. No respiratory distress. He has no wheezes. He has no rales.  Abdominal: Soft. Bowel sounds are normal. He exhibits no distension. There is no tenderness. There is no rebound and no guarding.  Musculoskeletal: Normal range of motion. He exhibits tenderness.  No Bony Tenderness of Right Arm. Tenderness upon palpation of right upper ribs.  Neurological: He is alert and oriented to person, place, and time.  Skin: Skin is warm and dry. No rash noted.  Bruising to right arm.  Psychiatric: He has a normal mood and affect. Judgment normal.  Nursing note and vitals reviewed.   ED Course   Procedures (including critical care time) DIAGNOSTIC STUDIES: Oxygen Saturation is 95% on RA, normal by my interpretation.    COORDINATION OF CARE: 8:17 PM-Discussed treatment plan which includes Right CXR with pt at bedside and pt agreed to plan. Pt was offered pain medications which he declined at this time.  Labs Review Labs Reviewed - No data to display  Imaging Review Dg Ribs Unilateral W/chest Right  03/02/2016  CLINICAL DATA:  Anterior rib pain, pushed over by horse EXAM: RIGHT RIBS AND CHEST - 3+ VIEW COMPARISON:  Chest radiographs dated 01/19/2015 FINDINGS: Lungs are clear.  No pleural effusion or pneumothorax. The heart is normal in size. No displaced right rib fracture is seen. IMPRESSION: No evidence of acute cardiopulmonary disease. No displaced right rib fracture is seen. Electronically Signed   By: Julian Hy M.D.   On: 03/02/2016 21:19   I have personally reviewed and evaluated these images and lab results as part of my medical decision-making.   EKG Interpretation None     MDM  PT DOES NOT WANT ANYTHING STRONG FOR PAIN AND HE CAN'T TAKE NSAIDS DUE TO RENAL INSUFFICIENCY.  HE AGREED TO TRY TRAMADOL RX TO SEE IF THAT WILL HELP.  HE WILL TAKE TYLENOL 1ST.  PT KNOWS TO RETURN IF WORSE. Final diagnoses:  Rib contusion, right, initial encounter     I personally performed the services described in this documentation, which was scribed in my presence. The recorded information has been reviewed and is accurate.   [name], MD 03/02/16 2132

## 2016-03-02 NOTE — ED Notes (Signed)
Pt teaching provided on medications that may cause drowsiness. Pt instructed not to drive or operate heavy machinery while taking the prescribed medication. Pt verbalized understanding.   

## 2016-03-02 NOTE — ED Notes (Signed)
Pt knocked down by horse-pain to right rib area-denies head or neck injury-NAD-steady gait to tx area

## 2016-04-09 ENCOUNTER — Other Ambulatory Visit: Payer: Self-pay | Admitting: Internal Medicine

## 2016-05-06 ENCOUNTER — Other Ambulatory Visit: Payer: Self-pay | Admitting: Internal Medicine

## 2016-06-10 ENCOUNTER — Other Ambulatory Visit: Payer: Self-pay | Admitting: Internal Medicine

## 2016-06-27 IMAGING — CR DG CHEST 2V
2 series · 2 of 2 positions shown · non-contrast
Comparison: None.

CLINICAL DATA: Numbness in the left arm down into the leg, on and
off. Hypertension.

EXAM:
CHEST  2 VIEW

[w chest pa]
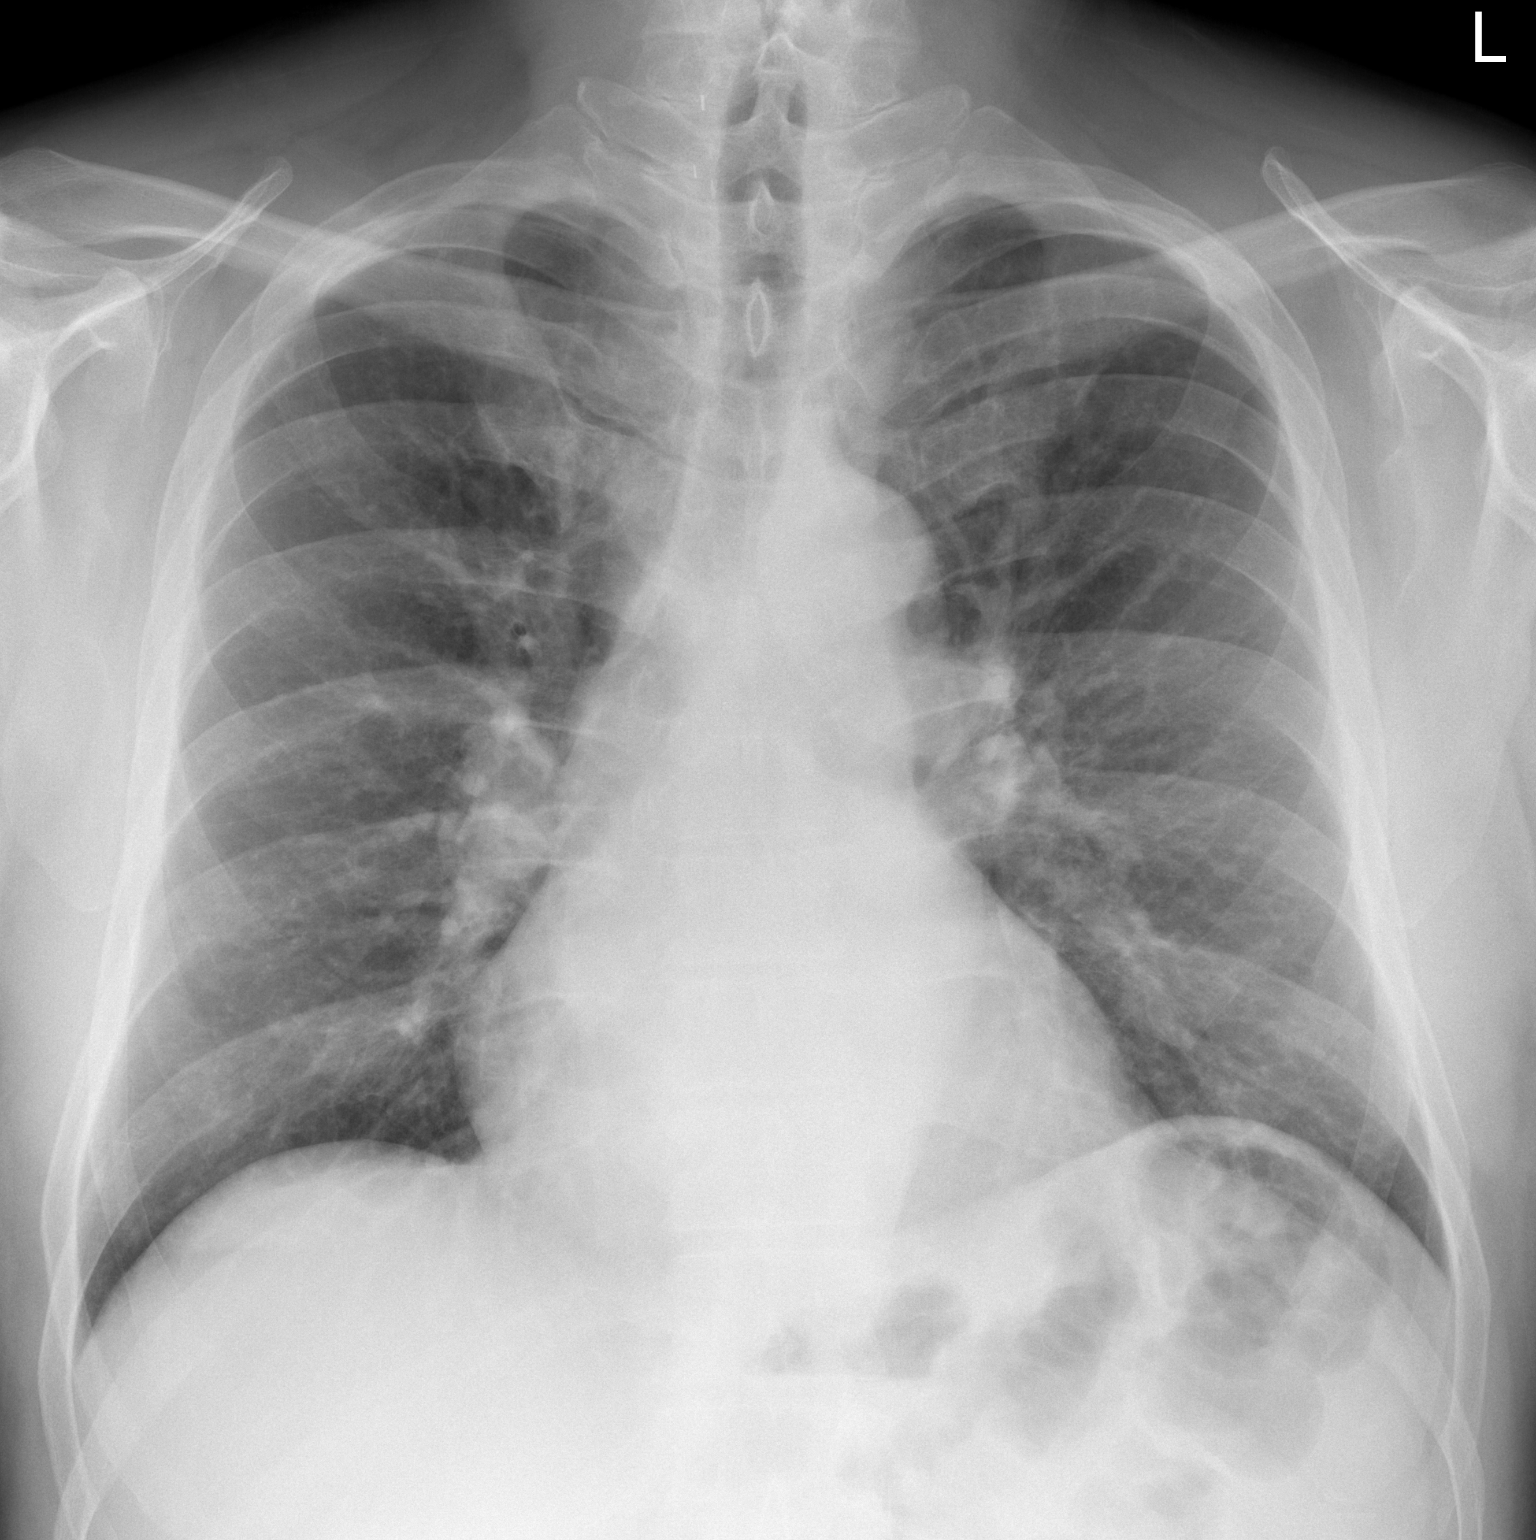

[w chest lat]
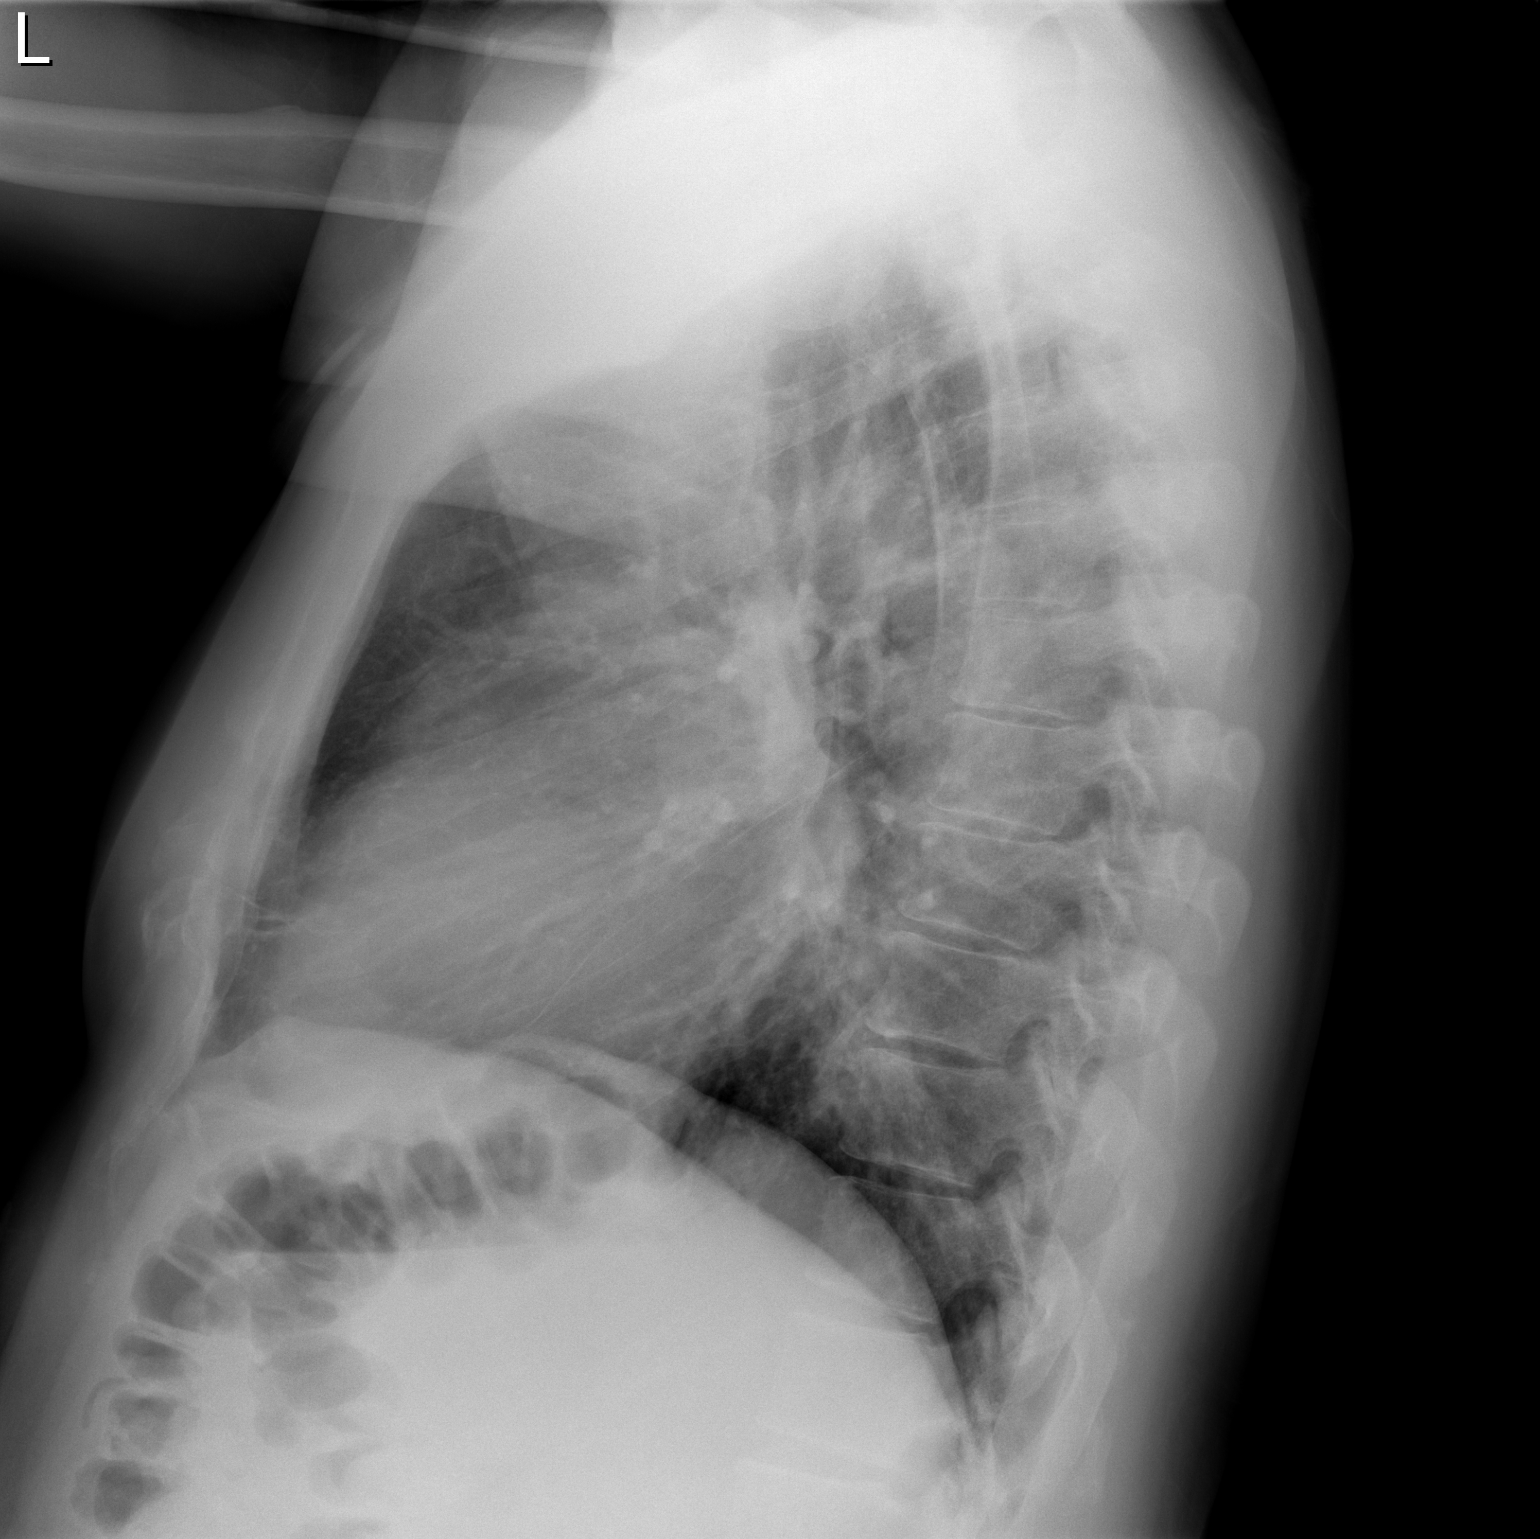

[2 of 2 positions shown; findings below may reference images not displayed]

FINDINGS: Generous heart size without definitive cardiomegaly. There is mild
to moderate aortic tortuosity for age. There is no edema,
consolidation, effusion, or pneumothorax. Surgical clips at the
thoracic inlet correlating with history of thyroid surgery.
IMPRESSION: No active cardiopulmonary disease.

## 2016-06-29 IMAGING — US US RENAL
1 series · 14 of 25 positions shown · non-contrast
Comparison: None.

CLINICAL DATA: Renal insufficiency.

EXAM:
RENAL/URINARY TRACT ULTRASOUND COMPLETE

[Series 1: us renal · 0.26mm/px · 14 of 63 slices shown]
[im 1/63]
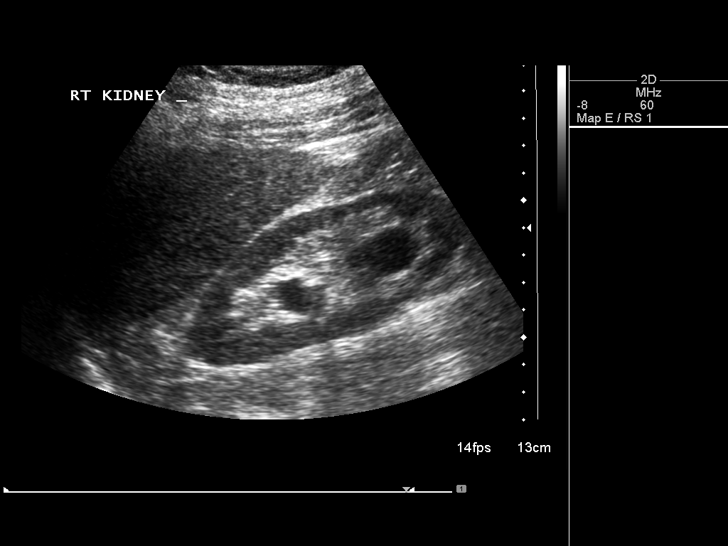
[im 6/63]
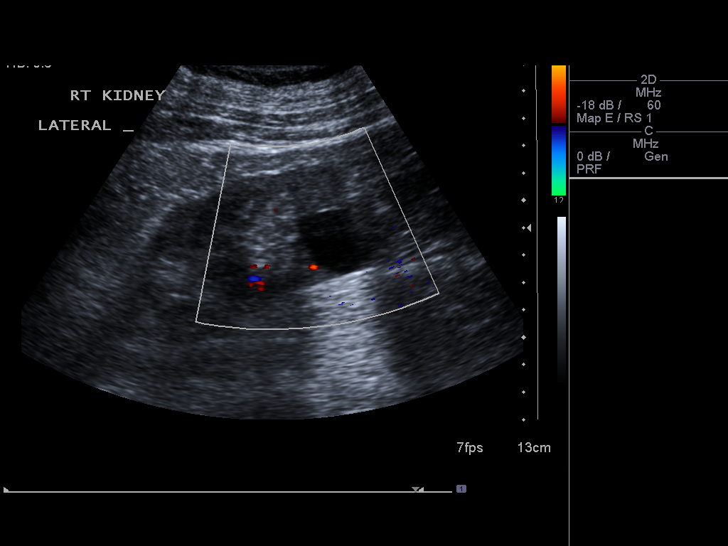
[im 11/63]
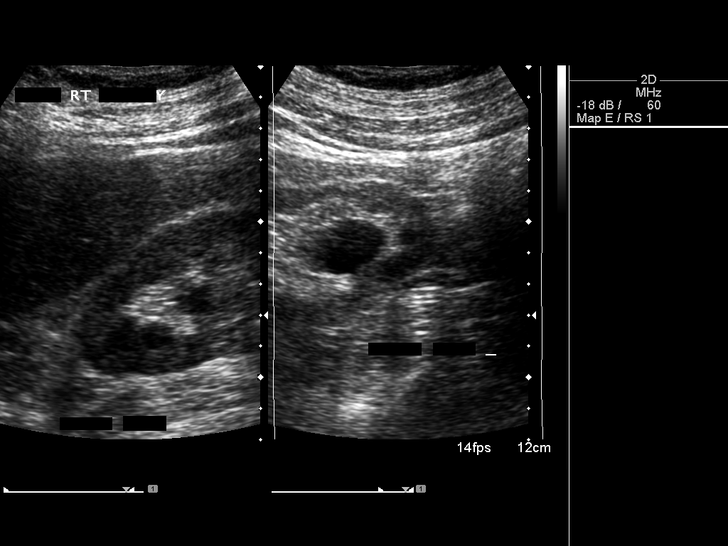
[im 16/63]
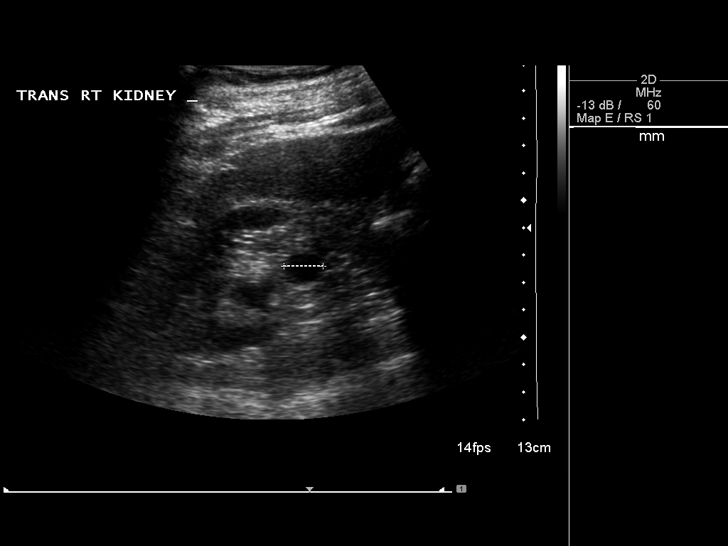
[im 21/63]
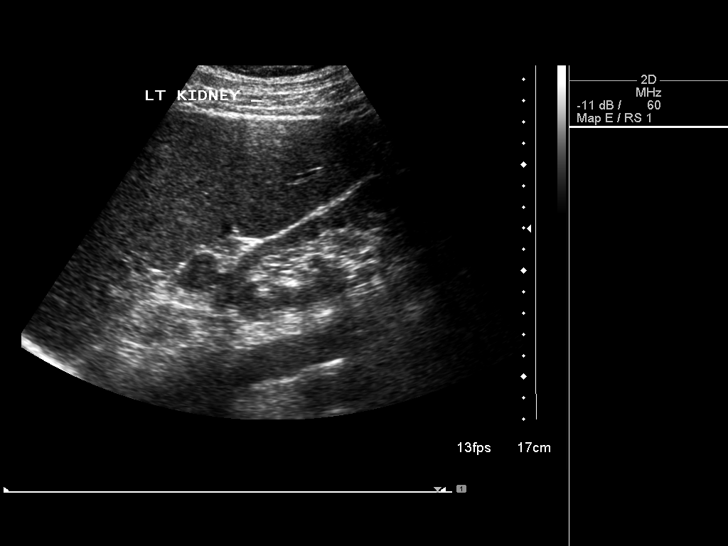
[im 24/63]
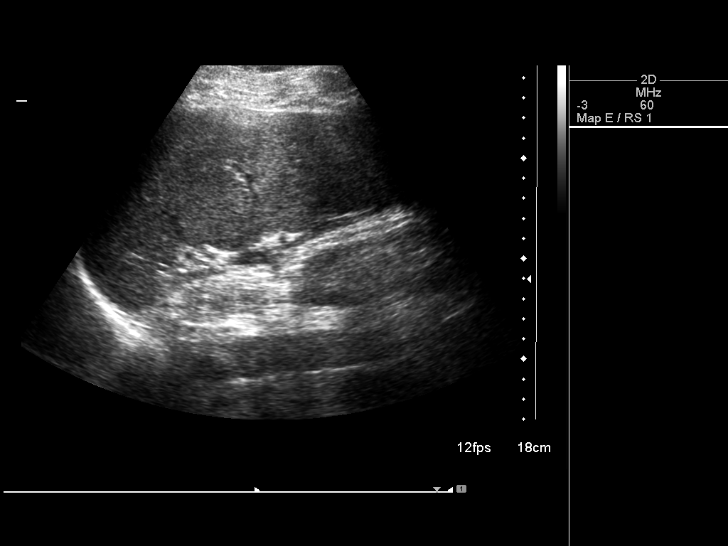
[im 29/63]
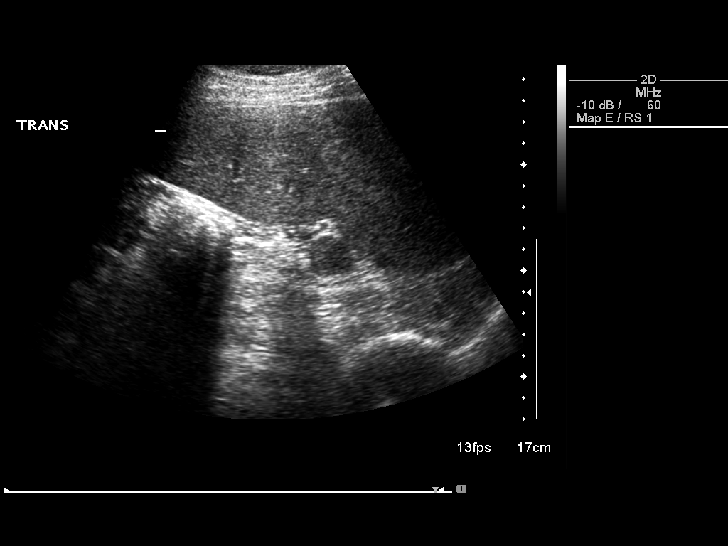
[im 34/63]
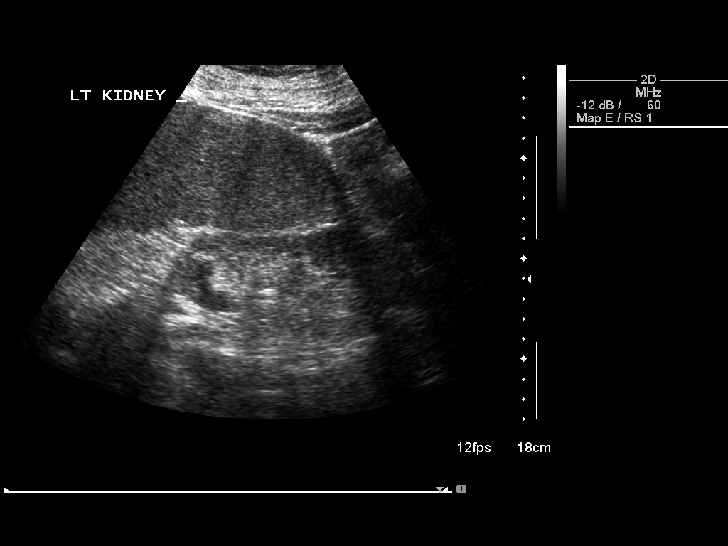
[im 39/63]
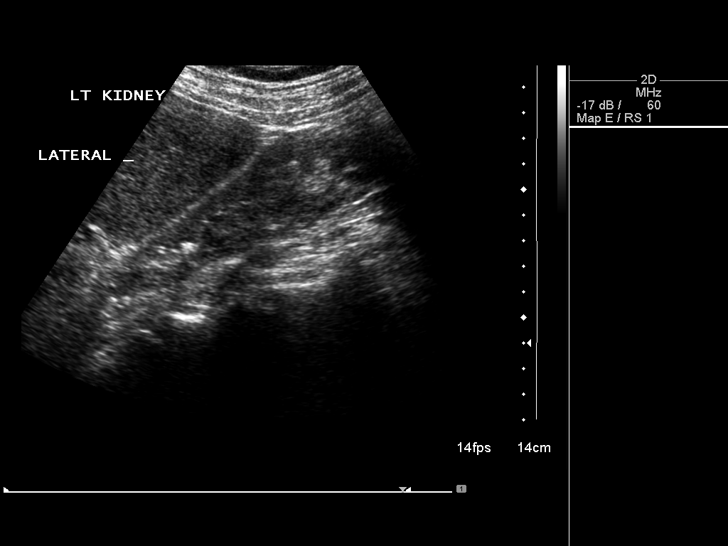
[im 42/63]
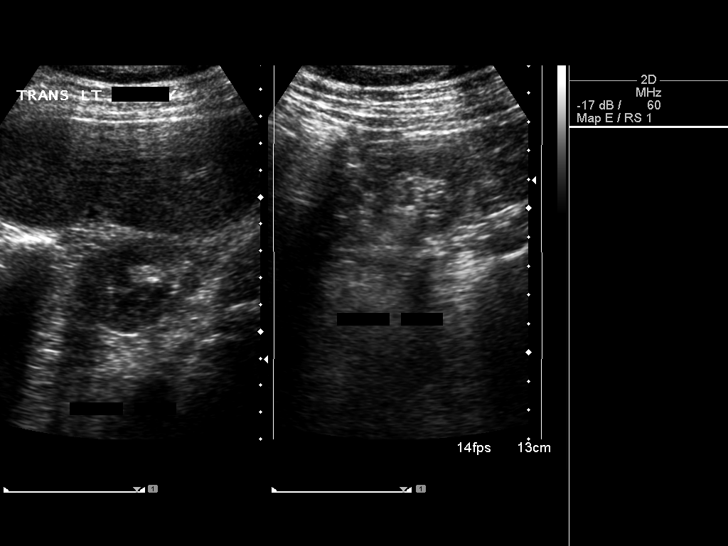
[im 47/63]
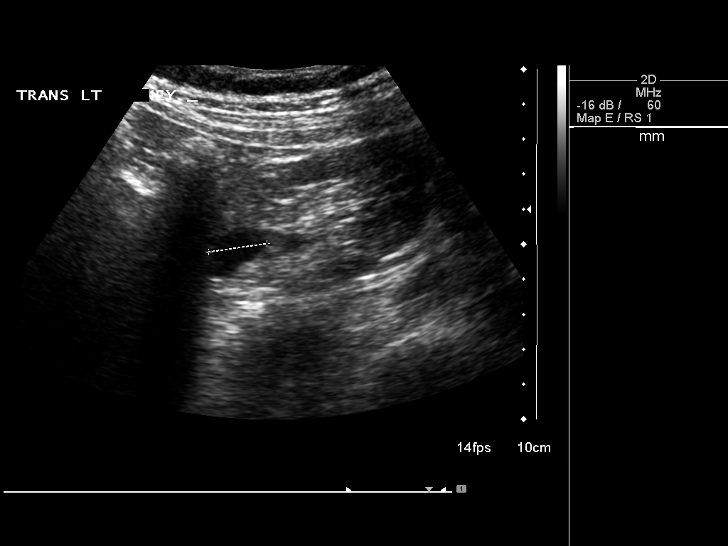
[im 52/63]
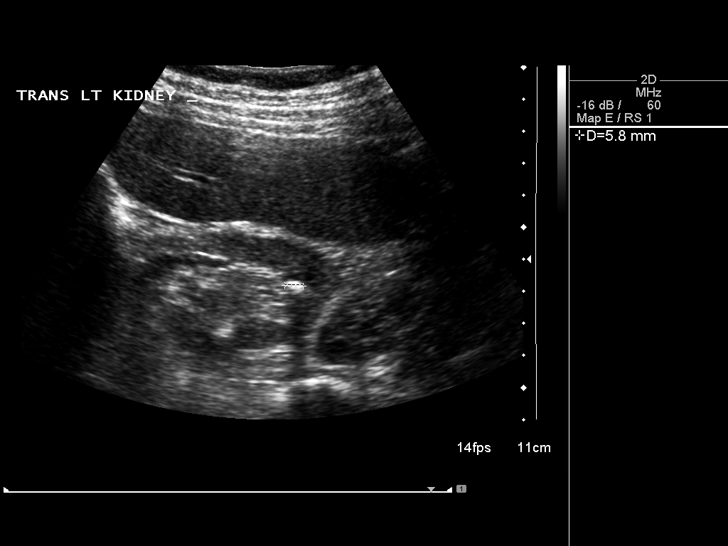
[im 57/63]
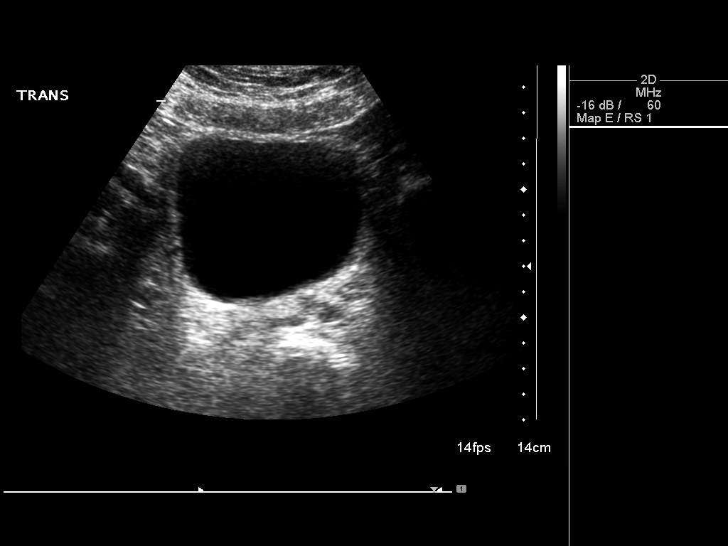
[im 63/63]
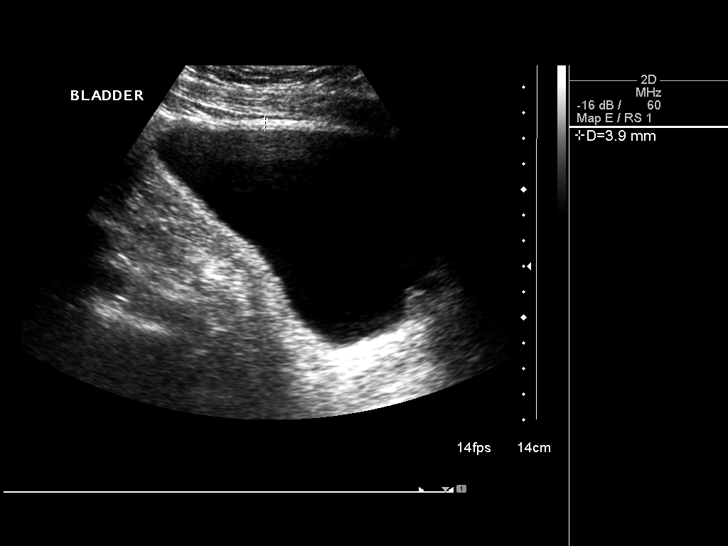

[14 of 25 positions shown; findings below may reference images not displayed]

FINDINGS: Right Kidney:

Length: 11.1 cm. 2.4 cm lower pole cyst and 1.6 cm mid upper cyst.
Echogenicity within normal limits. No mass or hydronephrosis
visualized.

Left Kidney:

Length: 12.3 cm. Diffuse cortical thinning and prominent renal sinus
fat. 6 mm upper pole calculus. 2.0 cm mid, medial cyst. Echogenicity
within normal limits. No mass or hydronephrosis visualized.

Bladder:

Appears normal for degree of bladder distention.

Moderately enlarged prostate gland protruding into the base of the
bladder. Diffusely enlarged spleen, measuring 18.1 cm in length and
with a calculated volume of 7825 cc.
IMPRESSION: 1. Mild diffuse left renal cortical atrophy.
2. No hydronephrosis.
3. 6 mm nonobstructing upper pole left renal calculus.
4. Bilateral renal cysts.
5. Moderately enlarged prostate gland.
6. Splenomegaly.

## 2016-07-14 ENCOUNTER — Other Ambulatory Visit: Payer: Self-pay | Admitting: Internal Medicine

## 2016-08-14 ENCOUNTER — Other Ambulatory Visit: Payer: Self-pay | Admitting: Internal Medicine

## 2016-09-18 ENCOUNTER — Other Ambulatory Visit: Payer: Self-pay | Admitting: Internal Medicine

## 2016-10-23 ENCOUNTER — Other Ambulatory Visit: Payer: Self-pay | Admitting: Internal Medicine

## 2016-11-29 ENCOUNTER — Other Ambulatory Visit: Payer: Self-pay | Admitting: Internal Medicine

## 2017-01-04 ENCOUNTER — Other Ambulatory Visit: Payer: Self-pay | Admitting: Internal Medicine

## 2017-01-04 NOTE — Telephone Encounter (Signed)
Not Seen since 2016, Please advise

## 2017-02-05 ENCOUNTER — Other Ambulatory Visit: Payer: Self-pay | Admitting: Internal Medicine

## 2017-02-10 MED ORDER — METOPROLOL SUCCINATE ER 50 MG PO TB24: 50.0000 mg | ORAL_TABLET | Freq: Every day | ORAL | 0 refills | Status: DC

## 2017-02-10 NOTE — Telephone Encounter (Signed)
Notified pt w/Md response sent temporary supply to [facility].../lm,b

## 2017-02-10 NOTE — Telephone Encounter (Signed)
Rec'd call pt states he has ran out of his metoprolol, and his appt is not until 03/03/17. Requesting MD to call in enough metoprolol until appt.Pls advise.Marland KitchenJohny Chess

## 2017-02-10 NOTE — Telephone Encounter (Signed)
Okay to call in enough to visit, but needs to keep visit

## 2017-03-03 ENCOUNTER — Ambulatory Visit (INDEPENDENT_AMBULATORY_CARE_PROVIDER_SITE_OTHER): Payer: Self-pay | Admitting: Internal Medicine

## 2017-03-03 ENCOUNTER — Encounter: Payer: Self-pay | Admitting: Internal Medicine

## 2017-03-03 DIAGNOSIS — I10 Essential (primary) hypertension: Secondary | ICD-10-CM

## 2017-03-03 MED ORDER — METOPROLOL SUCCINATE ER 50 MG PO TB24: 50.0000 mg | ORAL_TABLET | Freq: Every day | ORAL | 3 refills | Status: DC

## 2017-03-03 NOTE — Patient Instructions (Signed)
We have sent in the refills and will not change the medicines today.   Make sure to keep the visit with the kidney doctor.

## 2017-03-05 NOTE — Progress Notes (Signed)
   Subjective:    Patient ID: Howard Green, male    DOB: 1955/10/23, 61 y.o.   MRN: [id number]  HPI The patient is a 61 YO man coming in for follow up of blood pressure (got medications cut off since no visit since 2016, seeing nephrology, taking amlodipine and torsemide and metoprolol but has not taken this morning yet due to traveling here, most recent BP at nephrology at goal and creatinine stable, complicated by CKD stage 3-4). Denies chest pains, SOB, nausea, vomiting, abdominal pain, numbness, headaches. No new concerns.   Review of Systems  Constitutional: Negative.   Respiratory: Negative.   Cardiovascular: Negative.   Gastrointestinal: Negative.   Musculoskeletal: Negative.   Skin: Negative.   Neurological: Negative.       Objective:   Physical Exam  Constitutional: He is oriented to person, place, and time. He appears well-developed and well-nourished.  HENT:  Head: Normocephalic and atraumatic.  Eyes: EOM are normal.  Neck: Normal range of motion.  Cardiovascular: Normal rate and regular rhythm.   Pulmonary/Chest: Effort normal and breath sounds normal. No respiratory distress. He has no wheezes. He has no rales.  Abdominal: Soft. He exhibits no distension. There is no tenderness. There is no rebound.  Musculoskeletal: He exhibits no edema.  Neurological: He is alert and oriented to person, place, and time.  Skin: Skin is warm and dry.   Vitals:   03/03/17 1011 03/03/17 1038  BP: (!) 170/90 (!) 174/86  Pulse: 67   Resp: 12   Temp: 98.6 F (37 C)   TempSrc: Oral   SpO2: 98%   Weight: 265 lb (120.2 kg)   Height: 6\' 1"  (1.854 m)       Assessment & Plan:

## 2017-03-05 NOTE — Assessment & Plan Note (Signed)
BP not at goal today off meds, BP at goal on meds at recent nephrology visit. Taking amlodipine, metoprolol, torsemide. Recent BMP at goal and seeing nephrology next weeks and would like to keep all labs at the same place. Complicated by CKD stage 3-4 which is stable most recently. Reminded him of the risks of uncontrolled high blood pressure increased risk of heart attack or stroke.

## 2017-11-09 LAB — BASIC METABOLIC PANEL
BUN: 23 — AB (ref 4–21)
CREATININE: 1.9 — AB (ref <=1.3)
GLUCOSE: 112
POTASSIUM: 4.3 (ref 3.4–5.3)
Sodium: 140 (ref 137–147)

## 2017-11-09 LAB — CBC AND DIFFERENTIAL: HEMOGLOBIN: 12.9 — AB (ref 13.5–17.5)

## 2017-11-11 ENCOUNTER — Encounter: Payer: Self-pay | Admitting: Internal Medicine

## 2018-06-14 LAB — BASIC METABOLIC PANEL
BUN: 21 (ref 4–21)
CREATININE: 2.1 — AB (ref 0.6–1.3)
GLUCOSE: 98
Potassium: 4.6 (ref 3.4–5.3)
SODIUM: 139 (ref 137–147)

## 2018-06-16 ENCOUNTER — Encounter: Payer: Self-pay | Admitting: Internal Medicine

## 2018-07-21 ENCOUNTER — Other Ambulatory Visit: Payer: Self-pay

## 2018-07-21 ENCOUNTER — Emergency Department (HOSPITAL_BASED_OUTPATIENT_CLINIC_OR_DEPARTMENT_OTHER)
Admission: EM | Admit: 2018-07-21 | Discharge: 2018-07-21 | Disposition: A | Discharge status: Home / Self Care | Payer: Self-pay | Attending: Emergency Medicine | Admitting: Emergency Medicine

## 2018-07-21 ENCOUNTER — Encounter (HOSPITAL_BASED_OUTPATIENT_CLINIC_OR_DEPARTMENT_OTHER): Payer: Self-pay | Admitting: Emergency Medicine

## 2018-07-21 DIAGNOSIS — Z7982 Long term (current) use of aspirin: Secondary | ICD-10-CM | POA: Insufficient documentation

## 2018-07-21 DIAGNOSIS — Z79899 Other long term (current) drug therapy: Secondary | ICD-10-CM | POA: Insufficient documentation

## 2018-07-21 DIAGNOSIS — I10 Essential (primary) hypertension: Secondary | ICD-10-CM | POA: Insufficient documentation

## 2018-07-21 DIAGNOSIS — H1132 Conjunctival hemorrhage, left eye: Secondary | ICD-10-CM | POA: Insufficient documentation

## 2018-07-21 HISTORY — DX: Hemophthalmos, bilateral: H44.813

## 2018-07-21 MED ORDER — TETRACAINE HCL 0.5 % OP SOLN
OPHTHALMIC | Status: AC
  Administered 2018-07-21: 19:00:00
  Filled 2018-07-21: qty 4

## 2018-07-21 MED ORDER — ERYTHROMYCIN 5 MG/GM OP OINT: TOPICAL_OINTMENT | OPHTHALMIC | 0 refills | Status: DC

## 2018-07-21 MED ORDER — FLUORESCEIN SODIUM 1 MG OP STRP
ORAL_STRIP | OPHTHALMIC | Status: AC
  Administered 2018-07-21: 1 via OPHTHALMIC
  Filled 2018-07-21: qty 1

## 2018-07-21 NOTE — Discharge Instructions (Signed)
Follow up with your eye doctor.  This should get better on its own.  Return for change in vision or pain

## 2018-07-21 NOTE — ED Triage Notes (Signed)
Bleeding to left eye started today.  Pt states he thought there was something in his eye but then noticed there was bleeding.  Noted bleeding in his sclera.  Pt states he was emptying a horse trailer but doesn't really have the sensation of FB in his eye.  Pt states he has some pain in the corner in his eye.  Some blurry vision.  No changes in the field of vision.  Eye is watering.

## 2018-07-21 NOTE — ED Provider Notes (Signed)
[facility] EMERGENCY DEPARTMENT Provider Note   CSN: [id number] Arrival date & time: 07/21/18  1606     History   Chief Complaint Chief Complaint  Patient presents with  . Eye Problem    HPI Howard Green is a 62 y.o. male.  62 yo M with a cc of left eye redness.  The patient felt like something might of gotten in his eye and he rubbed it vigorously earlier today.  He noticed that his eye was red.  He has had this issue before but is never been this extensive before and so came to the ED.  He currently does not feel like anything is in his eye.  Denies any change of his vision.   The history is provided by the patient.  Eye Problem   This is a new problem. The current episode started 6 to 12 hours ago. The problem occurs constantly. The problem has not changed since onset.There is a problem in the left eye. Injury mechanism: finger. The pain is at a severity of 1/10. The pain is mild. There is history of trauma to the eye. There is no known exposure to pink eye. Associated symptoms include eye redness. Pertinent negatives include no discharge and no vomiting. He has tried nothing for the symptoms. The treatment provided no relief.    Past Medical History:  Diagnosis Date  . Allergy    seasonal like hayfever   . Bleeding of eye, bilateral   . Hypertension   . Thyroid disease    benign cyst removed 15 years ago    Patient Active Problem List   Diagnosis Date Noted  . Gout 03/21/2015  . Thyroid nodule 03/21/2015  . Numbness 01/23/2015  . Essential hypertension 01/03/2015    Past Surgical History:  Procedure Laterality Date  . THYROID SURGERY          Home Medications    Prior to Admission medications   Medication Sig Start Date End Date Taking? Authorizing Provider  amLODipine (NORVASC) 10 MG tablet Take 10 mg by mouth daily.  02/19/15   [provider]  aspirin 81 MG tablet Take 81 mg by mouth daily.    [provider]    erythromycin ophthalmic ointment Place a 1/2 inch ribbon of ointment into the lower eyelid four times a day for 7 days. 07/21/18   Deno Etienne, DO  metoprolol succinate (TOPROL-XL) 50 MG 24 hr tablet Take 1 tablet (50 mg total) by mouth daily. 03/03/17   [name], MD  torsemide (DEMADEX) 20 MG tablet Take 20 mg by mouth daily.    [provider]    Family History Family History  Problem Relation Age of Onset  . Diabetes Mother   . Other Mother        [place] disease  . Lung cancer Father        passed 69  . Healthy Brother   . Healthy Sister   . Healthy Son   . Healthy Daughter   . Colon cancer Neg Hx   . Rectal cancer Neg Hx   . Stomach cancer Neg Hx     Social History Social History   Tobacco Use  . Smoking status: Never Smoker  . Smokeless tobacco: Never Used  Substance Use Topics  . Alcohol use: No    Alcohol/week: 0.0 standard drinks  . Drug use: No     Allergies   Patient has no known allergies.   Review of Systems  Review of Systems  Constitutional: Negative for chills and fever.  HENT: Negative for congestion and facial swelling.   Eyes: Positive for pain and redness. Negative for discharge and visual disturbance.  Respiratory: Negative for shortness of breath.   Cardiovascular: Negative for chest pain and palpitations.  Gastrointestinal: Negative for abdominal pain, diarrhea and vomiting.  Musculoskeletal: Negative for arthralgias and myalgias.  Skin: Negative for color change and rash.  Neurological: Negative for tremors, syncope and headaches.  Psychiatric/Behavioral: Negative for confusion and dysphoric mood.     Physical Exam Updated Vital Signs BP (!) 167/80 (BP Location: Right Arm)   Pulse 80   Temp 98.4 F (36.9 C) (Oral)   Resp 18   Ht 6\' 1"  (1.854 m)   Wt 108.9 kg   SpO2 98%   BMI 31.66 kg/m   Physical Exam  Constitutional: He is oriented to person, place, and time. He appears well-developed and  well-nourished.  HENT:  Head: Normocephalic and atraumatic.  Eyes: Pupils are equal, round, and reactive to light. EOM are normal. Left eye exhibits chemosis. Left conjunctiva has a hemorrhage. Left eye exhibits normal extraocular motion.  Slit lamp exam:      The left eye shows no corneal abrasion, no hyphema and no hypopyon.  Neck: Normal range of motion. Neck supple. No JVD present.  Cardiovascular: Normal rate and regular rhythm. Exam reveals no gallop and no friction rub.  No murmur heard. Pulmonary/Chest: No respiratory distress. He has no wheezes.  Abdominal: He exhibits no distension. There is no rebound and no guarding.  Musculoskeletal: Normal range of motion.  Neurological: He is alert and oriented to person, place, and time.  Skin: No rash noted. No pallor.  Psychiatric: He has a normal mood and affect. His behavior is normal.  Nursing note and vitals reviewed.    ED Treatments / Results  Labs (all labs ordered are listed, but only abnormal results are displayed) Labs Reviewed - No data to display  EKG None  Radiology No results found.  Procedures Procedures (including critical care time)  Medications Ordered in ED Medications  fluorescein 1 MG ophthalmic strip (1 strip Both Eyes Given 07/21/18 1928)  tetracaine (PONTOCAINE) 0.5 % ophthalmic solution (  Given 07/21/18 1929)     Initial Impression / Assessment and Plan / ED Course  I have reviewed the triage vital signs and the nursing notes.  Pertinent labs & imaging results that were available during my care of the patient were reviewed by me and considered in my medical decision making (see chart for details).     62 yo M with a cc of eye redness.  Clinically with subconjunctival hematoma.  As the patient felt like something was in his eye earlier and it is difficult to evaluate the cornea in the area of the hematoma I will cover with erythromycin ointment.  7:50 PM:  I have discussed the  diagnosis/risks/treatment options with the patient and family and believe the pt to be eligible for discharge home to follow-up with PCP, ophtho. We also discussed returning to the ED immediately if new or worsening sx occur. We discussed the sx which are most concerning (e.g., sudden worsening pain, change in vision) that necessitate immediate return. Medications administered to the patient during their visit and any new prescriptions provided to the patient are listed below.  Medications given during this visit Medications  fluorescein 1 MG ophthalmic strip (1 strip Both Eyes Given 07/21/18 1928)  tetracaine (PONTOCAINE) 0.5 % ophthalmic solution (  Given 07/21/18 1929)     The patient appears reasonably screen and/or stabilized for discharge and I doubt any other medical condition or other [facility] requiring further screening, evaluation, or treatment in the ED at this time prior to discharge.    Final Clinical Impressions(s) / ED Diagnoses   Final diagnoses:  Subconjunctival hematoma, left    ED Discharge Orders         Ordered    erythromycin ophthalmic ointment     07/21/18 [place], [place], DO 07/21/18 1950

## 2019-09-02 ENCOUNTER — Emergency Department (HOSPITAL_BASED_OUTPATIENT_CLINIC_OR_DEPARTMENT_OTHER): Payer: Self-pay

## 2019-09-02 ENCOUNTER — Other Ambulatory Visit: Payer: Self-pay

## 2019-09-02 ENCOUNTER — Emergency Department (HOSPITAL_BASED_OUTPATIENT_CLINIC_OR_DEPARTMENT_OTHER)
Admission: EM | Admit: 2019-09-02 | Discharge: 2019-09-02 | Disposition: A | Discharge status: Home / Self Care | Payer: Self-pay | Attending: Emergency Medicine | Admitting: Emergency Medicine

## 2019-09-02 ENCOUNTER — Encounter (HOSPITAL_BASED_OUTPATIENT_CLINIC_OR_DEPARTMENT_OTHER): Payer: Self-pay | Admitting: Emergency Medicine

## 2019-09-02 DIAGNOSIS — D473 Essential (hemorrhagic) thrombocythemia: Secondary | ICD-10-CM | POA: Insufficient documentation

## 2019-09-02 DIAGNOSIS — Z7982 Long term (current) use of aspirin: Secondary | ICD-10-CM | POA: Insufficient documentation

## 2019-09-02 DIAGNOSIS — R42 Dizziness and giddiness: Secondary | ICD-10-CM | POA: Insufficient documentation

## 2019-09-02 DIAGNOSIS — I4891 Unspecified atrial fibrillation: Secondary | ICD-10-CM | POA: Insufficient documentation

## 2019-09-02 DIAGNOSIS — D75839 Thrombocytosis, unspecified: Secondary | ICD-10-CM

## 2019-09-02 DIAGNOSIS — I10 Essential (primary) hypertension: Secondary | ICD-10-CM | POA: Insufficient documentation

## 2019-09-02 DIAGNOSIS — Z79899 Other long term (current) drug therapy: Secondary | ICD-10-CM | POA: Insufficient documentation

## 2019-09-02 LAB — ETHANOL: Alcohol, Ethyl (B): <10 mg/dL (ref <10)

## 2019-09-02 LAB — DIFFERENTIAL
Abs Immature Granulocytes: 0.07 10*3/uL (ref 0.00–0.07)
Basophils Absolute: 0.1 10*3/uL (ref 0.0–0.1)
Basophils Relative: 1 %
Eosinophils Absolute: 0.2 10*3/uL (ref 0.0–0.5)
Eosinophils Relative: 2 %
Immature Granulocytes: 1 %
Lymphocytes Relative: 18 %
Lymphs Abs: 2.1 10*3/uL (ref 0.7–4.0)
Monocytes Absolute: 0.9 10*3/uL (ref 0.1–1.0)
Monocytes Relative: 8 %
Neutro Abs: 8.3 10*3/uL — ABNORMAL HIGH (ref 1.7–7.7)
Neutrophils Relative %: 70 %

## 2019-09-02 LAB — COMPREHENSIVE METABOLIC PANEL
ALT: 13 U/L (ref 0–44)
AST: 17 U/L (ref 15–41)
Albumin: 3.9 g/dL (ref 3.5–5.0)
Alkaline Phosphatase: 58 U/L (ref 38–126)
Anion gap: 8 (ref 5–15)
BUN: 26 mg/dL — ABNORMAL HIGH (ref 8–23)
CO2: 23 mmol/L (ref 22–32)
Calcium: 8.6 mg/dL — ABNORMAL LOW (ref 8.9–10.3)
Chloride: 111 mmol/L (ref 98–111)
Creatinine, Ser: 1.92 mg/dL — ABNORMAL HIGH (ref 0.61–1.24)
GFR calc Af Amer: 42 mL/min — ABNORMAL LOW (ref >60)
GFR calc non Af Amer: 36 mL/min — ABNORMAL LOW (ref >60)
Glucose, Bld: 123 mg/dL — ABNORMAL HIGH (ref 70–99)
Potassium: 3.9 mmol/L (ref 3.5–5.1)
Sodium: 142 mmol/L (ref 135–145)
Total Bilirubin: 0.5 mg/dL (ref 0.3–1.2)
Total Protein: 7.5 g/dL (ref 6.5–8.1)

## 2019-09-02 LAB — CBC
HCT: 46 % (ref 39.0–52.0)
Hemoglobin: 14.6 g/dL (ref 13.0–17.0)
MCH: 27 pg (ref 26.0–34.0)
MCHC: 31.7 g/dL (ref 30.0–36.0)
MCV: 85.2 fL (ref 80.0–100.0)
Platelets: 916 10*3/uL (ref 150–400)
RBC: 5.4 MIL/uL (ref 4.22–5.81)
RDW: 15.2 % (ref 11.5–15.5)
WBC: 11.6 10*3/uL — ABNORMAL HIGH (ref 4.0–10.5)
nRBC: 0 % (ref 0.0–0.2)

## 2019-09-02 LAB — CBG MONITORING, ED: Glucose-Capillary: 122 mg/dL — ABNORMAL HIGH (ref 70–99)

## 2019-09-02 LAB — PROTIME-INR
INR: 1.2 (ref 0.8–1.2)
Prothrombin Time: 15.1 seconds (ref 11.4–15.2)

## 2019-09-02 LAB — TSH: TSH: 3.667 u[IU]/mL (ref 0.350–4.500)

## 2019-09-02 LAB — MAGNESIUM: Magnesium: 2.3 mg/dL (ref 1.7–2.4)

## 2019-09-02 LAB — APTT: aPTT: 39 seconds — ABNORMAL HIGH (ref 24–36)

## 2019-09-02 MED ORDER — MECLIZINE HCL 12.5 MG PO TABS: 12.5000 mg | ORAL_TABLET | Freq: Three times a day (TID) | ORAL | 0 refills | Status: DC | PRN

## 2019-09-02 MED ORDER — METOPROLOL TARTRATE 5 MG/5ML IV SOLN
2.5000 mg | Freq: Once | INTRAVENOUS | Status: AC
  Administered 2019-09-02: 2.5 mg via INTRAVENOUS
  Filled 2019-09-02: qty 5

## 2019-09-02 NOTE — ED Notes (Signed)
Code stroke cancelled per teleneuro

## 2019-09-02 NOTE — ED Triage Notes (Signed)
Was laying in bed watching a video and experienced sudden onset of dizziness at 630 this morning. States he had several episodes where the room was spinning. He still feels "unsteady" but not quite as dizzy. Denies chest pain, slurred speech, weakness to arms/legs.

## 2019-09-02 NOTE — ED Provider Notes (Signed)
.  Critical Care Performed by: Hayden Rasmussen, MD Authorized by: Hayden Rasmussen, MD   Critical care provider statement:    Critical care time (minutes):  30   Critical care time was exclusive of:  Separately billable procedures and treating other patients   Critical care was necessary to treat or prevent imminent or life-threatening deterioration of the following conditions:  CNS failure or compromise   Critical care was time spent personally by me on the following activities:  Discussions with consultants, evaluation of patient's response to treatment, examination of patient, ordering and performing treatments and interventions, ordering and review of laboratory studies, ordering and review of radiographic studies, pulse oximetry, re-evaluation of patient's condition, obtaining history from patient or surrogate, review of old charts and development of treatment plan with patient or surrogate   I assumed direction of critical care for this patient from another provider in my specialty: no       Hayden Rasmussen, MD 09/02/19 [phone]

## 2019-09-02 NOTE — ED Notes (Signed)
Pt parked in the lobby to wait on his ride. I instructed him to get the attention of a staff member for assistance when his ride arrives.

## 2019-09-02 NOTE — Discharge Instructions (Signed)
Today you were noted to be in atrial fibrillation.  You should continue taking your metoprolol at home for this.  Make sure you are taking your 81 mg aspirin.  You were given an ambulatory referral to the A. fib clinic.  They should call you to schedule an appointment however if you not hear from them the next few days please call the clinic to make an appointment for follow-up.  Additionally, you were found to have some abnormalities on your blood test today and for this you will need to follow-up with the hematologist.  Please call the office to schedule an appointment within the next 1 to 2 weeks.    With regards to your dizziness today, you were given a prescription for medication called meclizine.  Please take as directed.  Do not drive, drink alcohol or operate machinery after taking this medication neck as it can make you very tired.  You should follow-up with your regular doctor in regards to your dizziness.  Return the emergency department for new or worsening symptoms in the meantime.

## 2019-09-02 NOTE — ED Provider Notes (Signed)
[facility]ENCY DEPARTMENT Provider Note   CSN: [id number] Arrival date & time: 09/02/19  [id number]  An emergency department physician performed an initial assessment on this suspected stroke patient at 0922.  History   Chief Complaint Chief Complaint  Patient presents with  . Dizziness    HPI Mycal Sistare is a 63 y.o. male.     HPI   Patient is a 63 year old male with a history of hypertension, who presents to the emergency department today for evaluation of dizziness.  Patient states he was sitting watching a video on his phone this morning when he experienced an episode of sudden onset dizziness described as room spinning.  This lasted for short time and resolve spontaneously.  He later had 2 episodes of recurrent vertigo upon standing.  Had an episode where he felt on steady on his feet.  He decides of feeling of lightheadedness or near syncope.  He denies headaches, visual changes, chest pain, shortness of breath, slurred speech or weakness.  Denies any recent fevers or infectious symptoms.  Past Medical History:  Diagnosis Date  . Allergy    seasonal like hayfever   . Bleeding of eye, bilateral   . Hypertension   . Thyroid disease    benign cyst removed 15 years ago    Patient Active Problem List   Diagnosis Date Noted  . Gout 03/21/2015  . Thyroid nodule 03/21/2015  . Numbness 01/23/2015  . Essential hypertension 01/03/2015    Past Surgical History:  Procedure Laterality Date  . THYROID SURGERY          Home Medications    Prior to Admission medications   Medication Sig Start Date End Date Taking? Authorizing Provider  amLODipine (NORVASC) 10 MG tablet Take 10 mg by mouth daily.  02/19/15   [provider]  aspirin 81 MG tablet Take 81 mg by mouth daily.    [provider]  erythromycin ophthalmic ointment Place a 1/2 inch ribbon of ointment into the lower eyelid four times a day for 7 days. 07/21/18   Deno Etienne, DO   meclizine (ANTIVERT) 12.5 MG tablet Take 1 tablet (12.5 mg total) by mouth 3 (three) times daily as needed for dizziness. 09/02/19   Tinleigh Whitmire S, PA-C  metoprolol succinate (TOPROL-XL) 50 MG 24 hr tablet Take 1 tablet (50 mg total) by mouth daily. 03/03/17   [name], MD  torsemide (DEMADEX) 20 MG tablet Take 20 mg by mouth daily.    [provider]    Family History Family History  Problem Relation Age of Onset  . Diabetes Mother   . Other Mother        [place] disease  . Lung cancer Father        passed 29  . Healthy Brother   . Healthy Sister   . Healthy Son   . Healthy Daughter   . Colon cancer Neg Hx   . Rectal cancer Neg Hx   . Stomach cancer Neg Hx     Social History Social History   Tobacco Use  . Smoking status: Never Smoker  . Smokeless tobacco: Never Used  Substance Use Topics  . Alcohol use: No    Alcohol/week: 0.0 standard drinks  . Drug use: No     Allergies   Patient has no known allergies.   Review of Systems Review of Systems  Constitutional: Negative for fever.  HENT: Negative for ear pain and sore throat.   Eyes: Negative  for visual disturbance.  Respiratory: Negative for cough and shortness of breath.   Cardiovascular: Negative for chest pain.  Gastrointestinal: Negative for abdominal pain, constipation, diarrhea, nausea and vomiting.  Genitourinary: Negative for dysuria and hematuria.  Musculoskeletal: Negative for back pain.  Skin: Negative for rash.  Neurological: Positive for dizziness. Negative for syncope, facial asymmetry, speech difficulty, weakness, light-headedness, numbness and headaches.  All other systems reviewed and are negative.    Physical Exam Updated Vital Signs BP 127/87   Pulse 87   Temp 98.7 F (37.1 C) (Oral)   Resp 12   Ht 6' (1.829 m)   Wt 113.4 kg   SpO2 96%   BMI 33.91 kg/m   Physical Exam Vitals signs and nursing note reviewed.  Constitutional:      Appearance: He  is well-developed.  HENT:     Head: Normocephalic and atraumatic.  Eyes:     Extraocular Movements: Extraocular movements intact.     Conjunctiva/sclera: Conjunctivae normal.     Pupils: Pupils are equal, round, and reactive to light.     Comments: No nystagumus  Neck:     Musculoskeletal: Neck supple.  Cardiovascular:     Heart sounds: No murmur.     Comments: Tachycardic, irregularly irregular Pulmonary:     Effort: Pulmonary effort is normal. No respiratory distress.     Breath sounds: Normal breath sounds. No wheezing, rhonchi or rales.  Abdominal:     Palpations: Abdomen is soft.     Tenderness: There is no abdominal tenderness.  Skin:    General: Skin is warm and dry.  Neurological:     Mental Status: He is alert.     Comments: Mental Status:  Alert, thought content appropriate, able to give a coherent history. Speech fluent without evidence of aphasia. Able to follow 2 step commands without difficulty.  Cranial Nerves:  II: pupils equal, round, reactive to light III,IV, VI: ptosis not present, extra-ocular motions intact bilaterally  V,VII: smile symmetric, facial light touch sensation equal VIII: hearing grossly normal to voice  X: uvula elevates symmetrically  XI: bilateral shoulder shrug symmetric and strong XII: midline tongue extension without fassiculations Motor:  Normal tone. 5/5 strength of BUE and BLE major muscle groups including strong and equal grip strength and dorsiflexion/plantar flexion Sensory: light touch normal in all extremities. Cerebellar: normal finger-to-nose with bilateral upper extremities, normal heel to shin Gait: normal gait and balance. Mild difficulty with heel to toe walking    ED Treatments / Results  Labs (all labs ordered are listed, but only abnormal results are displayed) Labs Reviewed  APTT - Abnormal; Notable for the following components:      Result Value   aPTT 39 (*)    All other components within normal limits  CBC -  Abnormal; Notable for the following components:   WBC 11.6 (*)    Platelets 916 (*)    All other components within normal limits  DIFFERENTIAL - Abnormal; Notable for the following components:   Neutro Abs 8.3 (*)    All other components within normal limits  COMPREHENSIVE METABOLIC PANEL - Abnormal; Notable for the following components:   Glucose, Bld 123 (*)    BUN 26 (*)    Creatinine, Ser 1.92 (*)    Calcium 8.6 (*)    GFR calc non Af Amer 36 (*)    GFR calc Af Amer 42 (*)    All other components within normal limits  CBG MONITORING, ED - Abnormal; Notable  for the following components:   Glucose-Capillary 122 (*)    All other components within normal limits  ETHANOL  PROTIME-INR  MAGNESIUM  TSH    EKG EKG Interpretation  Date/Time:  Sunday September 02 2019 09:04:18 EST Ventricular Rate:  104 PR Interval:    QRS Duration: 97 QT Interval:  351 QTC Calculation: 462 R Axis:   50 Text Interpretation: Atrial fibrillation Borderline T abnormalities, inferior leads lead reversal fixed Confirmed by Aletta Edouard [phone]) on 09/02/2019 9:11:10 AM   Radiology Ct Head Code Stroke Wo Contrast  Result Date: 09/02/2019 CLINICAL DATA:  Code stroke. Acute vertigo. New onset atrial fibrillation. History of hypertension. EXAM: CT HEAD WITHOUT CONTRAST TECHNIQUE: Contiguous axial images were obtained from the base of the skull through the vertex without intravenous contrast. COMPARISON:  01/19/2015 FINDINGS: Brain: There is no evidence of acute infarct, intracranial hemorrhage, mass, midline shift, or extra-axial fluid collection. The ventricles and sulci are normal. Vascular: Mild calcified atherosclerosis at the skull base. No hyperdense vessel. Skull: No fracture or focal osseous lesion. Sinuses/Orbits: Mild right maxillary sinus mucosal thickening. Clear mastoid air cells. Unremarkable orbits. Other: None. ASPECTS [facility] Stroke Program Early CT Score) Not scored with this  symptomatology. IMPRESSION: Unremarkable CT appearance of the brain. These results were called by telephone at the time of interpretation on 09/02/2019 at 9:37 am to provider [facility] , who verbally acknowledged these results. Electronically Signed   By: Logan Bores M.D.   On: 09/02/2019 09:37    Procedures Procedures (including critical care time)  Medications Ordered in ED Medications  metoprolol tartrate (LOPRESSOR) injection 2.5 mg (2.5 mg Intravenous Given 09/02/19 1016)     Initial Impression / Assessment and Plan / ED Course  I have reviewed the triage vital signs and the nursing notes.  Pertinent labs & imaging results that were available during my care of the patient were reviewed by me and considered in my medical decision making (see chart for details).  Clinical Course as of Sep 01 1313  Sun Sep 02, 5179  3347 63 year old male history of hypertension here with acute onset of dizziness unsteadiness that began around 630 this morning.  Initial EKG shows him to be in atrial fibrillation with a rate around 100.  Grossly nonfocal neuro exam.  Stroke activation ongoing.   [MB]    Clinical Course User Index [MB] Hayden Rasmussen, MD    Final Clinical Impressions(s) / ED Diagnoses   Final diagnoses:  Vertigo  Atrial fibrillation, unspecified type ([place])  Thrombocytosis ([place])    63 y/o male presenting for eval of dizziness. This episodic, but happens at rest and with movement. Initially htn, vs otherwise reassuring.   EKG with new onset afib. Marginally tachycardic.   - unclear onset at this time as pt is asymptomatic from his afib in the ED.  Initial evaluation complete. Neuro exam is grossly benign however pt does have some slight difficulty with heel to toe walking. Patient found to be in new onset atrial fibrillation.  With reports of intermittent vertigo and ataxia, there is concern for possible central cause of vertigo.  LNW 6:30 AM. Discussed with Dr. Melina Copa who  is in agreement to initiate code stroke. Orders placed.   9:52 AM Discussed case with Dr. Karle Barr with teleneurology. She feels that his vertigo is likely peripheral in nature and feels that code stroke can be cancelled and she does not feel further imaging or stroke w/u is needed. CT head negative.   This  patients CHA2DS2-VASc Score and unadjusted Ischemic Stroke Rate (% per year) is equal to 0.6 % stroke rate/year from a score of 1 Above score calculated as 1 point each if present [CHF, HTN, DM, Vascular=MI/PAD/Aortic Plaque, Age if 65-74, or Male] Above score calculated as 2 points each if present [Age > 75, or Stroke/TIA/TE]  CBC with mild leukocytosis, no anemia, thrombocytosis present which appears chronic but worse. Has not had this w/u before.   - 11:30 PM Consult with Dr. Alen Blew with hematology. He states that pt can have routine eval of this as outpatient given its chronicity. He also states that if patient does not have any other indication for anticoagulation from Afib then he would recommend 81 mg ASA.  CMP with elevated Bun/Cr consistent with prior, otherwise unremarkable Coags grossly unremarkable ETOH neg TSH pending at d/c. Will f/u in afib clinic or pcp for this Mg wnl  Pt given a dose of metop IV in the ED. Rate was better controlled following this and remained around under 100. Pt hemodynamically stable. Pt on 81mg  asa, will advise to continue. Will increase his metoprolol. Referral given for afib clinic and hematology. Advised pcp f/u for BPPV and will give meclizine. Advised to return to the ED for new or worsening sxs. He voices understanding and is in agreement with plan. All questions answered, pt stable for d/c.    ED Discharge Orders         Ordered    Amb referral to AFIB Clinic     09/02/19 0957    meclizine (ANTIVERT) 12.5 MG tablet  3 times daily PRN     09/02/19 1314           Madolin Twaddle S, PA-C 09/02/19 1314    Hayden Rasmussen, MD 09/02/19  [phone]

## 2019-09-02 NOTE — Consult Note (Addendum)
[facility] Tele[facility]urology Consult Services   Date of Service:   09/02/2019 09:28:36  Impression:     .  BPPV  Comments/Sign-Out: THe patient is a very pleasant gentleman with vertigo that sounds classic for benign paroxysmal positional vertigo even though he is inn new onset afib. He is able to get up and ambulate without any recurrence of the dizziness. No nystagmus seen on exam no limb or truncal ataxia seen. He really felt like the vertigo was intermittent and lasted less than 10 seconds at a time and is pretty classic for a vestibular issue. It seems like his symptoms are better and willl may need hosspitalization and workup for his new onset atrial fibrillationn suspect these things are unrelated do not feel an entire stroke workup is appropriate. Of course if he develops new focal neurologic symptoms with the vertigo returns in a persistent fashion is moore typical of a central process additional neurodiagnostic could be considered at the discretion of inpatient neurology  Metrics: Last Known Well: 09/02/2019 06:15:00 [facility] Notification Time: 09/02/2019 09:28:36 Arrival Time: 09/02/2019 08:46:00 Stamp Time: 09/02/2019 09:28:36 Time First Login Attempt: 09/02/2019 09:31:00 Video Start Time: 09/02/2019 09:34:26  Symptoms: vertigo NIHSS Start Assessment Time: 09/02/2019 09:36:20 Patient is not a candidate for Alteplase/Activase. Patient was not deemed candidate for Alteplase/Activase thrombolytics because of Resolved symptoms (no residual disabling symptoms). Video End Time: 09/02/2019 09:49:40  CT head showed no acute hemorrhage or acute core infarct.  Clinical Presentation is not Suggestive of Large Vessel Occlusive Disease  Radiologist was not called back for review of advanced imaging because na ED Physician notified of diagnostic impression and management plan on 09/02/2019 09:53:45  Our recommendations are outlined below.   Sign Out:     .  Discussed with  Emergency Department Provider    ------------------------------------------------------------------------------  History of Present Illness: Patient is a 63 year old Male.  Patient was brought by private transportation with symptoms of vertigo  the patient is a very pleasant 63 year old gentleman who describes the sudden onset vertigo this morning when he was getting out of bed lasting 5-6 seconds then two morespells also. This was at 18:15. The second time he was turning over in bed once again lasting ten seconds. THe third time he was gain getting out of bed got dizzy and it happened one more time. THe third time lasted about 15 seconds. Has hx of HTN, thyroid nodule and has new afib in the emergency department so he is not on any blood thinners. BG 122 on arrival. No nausea or cp, no speech issues. No focal weakness numbness or tinging either.   Past Medical History:     . Hypertension     . There is NO history of Diabetes Mellitus     . There is NO history of Hyperlipidemia     . There is NO history of Atrial Fibrillation     . There is NO history of Coronary Artery Disease     . There is NO history of Stroke  Anticoagulant use:  No  Antiplatelet use: No  Examination: BP(143/84), Pulse(97), Blood Glucose(122) 1A: Level of Consciousness - Alert; keenly responsive + 0 1B: Ask Month and Age - Both Questions Right + 0 1C: Blink Eyes & Squeeze Hands - Performs Both Tasks + 0 2: Test Horizontal Extraocular Movements - Normal + 0 3: Test Visual Fields - No Visual Loss + 0 4: Test Facial Palsy (Use Grimace if Obtunded) - Normal symmetry + 0 5A: Test Left Arm  Motor Drift - No Drift for 10 Seconds + 0 5B: Test Right Arm Motor Drift - No Drift for 10 Seconds + 0 6A: Test Left Leg Motor Drift - No Drift for 5 Seconds + 0 6B: Test Right Leg Motor Drift - No Drift for 5 Seconds + 0 7: Test Limb Ataxia (FNF/Heel-Shin) - No Ataxia + 0 8: Test Sensation - Normal; No sensory loss + 0 9:  Test Language/Aphasia - Normal; No aphasia + 0 10: Test Dysarthria - Normal + 0 11: Test Extinction/Inattention - No abnormality + 0  NIHSS Score: 0  Pre-Morbid Modified Ranking Scale: 0 Points = No symptoms at all   Patient/Family was informed the Neurology Consult would happen via TeleHealth consult by way of interactive audio and video telecommunications and consented to receiving care in this manner.   Due to the immediate potential for life-threatening deterioration due to underlying acute neurologic illness, I spent 35 minutes providing critical care. This time includes time for face to face visit via telemedicine, review of medical records, imaging studies and discussion of findings with providers, the patient and/or family.   Dr Katina Degree   [facility] [phone]   Case [id number]

## 2019-09-02 NOTE — ED Notes (Signed)
Patient transported to CT 

## 2019-09-03 ENCOUNTER — Ambulatory Visit: Payer: Self-pay

## 2019-09-03 ENCOUNTER — Encounter: Payer: Self-pay | Admitting: Internal Medicine

## 2019-09-03 ENCOUNTER — Ambulatory Visit (INDEPENDENT_AMBULATORY_CARE_PROVIDER_SITE_OTHER): Payer: Self-pay | Admitting: Internal Medicine

## 2019-09-03 ENCOUNTER — Other Ambulatory Visit: Payer: Self-pay

## 2019-09-03 VITALS — BP 140/70 | HR 88 | Temp 98.7°F | Ht 72.0 in | Wt 255.0 lb

## 2019-09-03 DIAGNOSIS — H6121 Impacted cerumen, right ear: Secondary | ICD-10-CM

## 2019-09-03 DIAGNOSIS — R42 Dizziness and giddiness: Secondary | ICD-10-CM | POA: Insufficient documentation

## 2019-09-03 DIAGNOSIS — D75839 Thrombocytosis, unspecified: Secondary | ICD-10-CM | POA: Insufficient documentation

## 2019-09-03 DIAGNOSIS — I4819 Other persistent atrial fibrillation: Secondary | ICD-10-CM | POA: Insufficient documentation

## 2019-09-03 DIAGNOSIS — I4891 Unspecified atrial fibrillation: Secondary | ICD-10-CM

## 2019-09-03 DIAGNOSIS — D473 Essential (hemorrhagic) thrombocythemia: Secondary | ICD-10-CM

## 2019-09-03 DIAGNOSIS — I10 Essential (primary) hypertension: Secondary | ICD-10-CM

## 2019-09-03 NOTE — Patient Instructions (Signed)
Benign Positional Vertigo Vertigo is the feeling that you or your surroundings are moving when they are not. Benign positional vertigo is the most common form of vertigo. This is usually a harmless condition (benign). This condition is positional. This means that symptoms are triggered by certain movements and positions. This condition can be dangerous if it occurs while you are doing something that could cause harm to you or others. This includes activities such as driving or operating machinery. What are the causes? In many cases, the cause of this condition is not known. It may be caused by a disturbance in an area of the inner ear that helps your brain to sense movement and balance. This disturbance can be caused by:  Viral infection (labyrinthitis).  Head injury.  Repetitive motion, such as jumping, dancing, or running. What increases the risk? You are more likely to develop this condition if:  You are a woman.  You are 18 years of age or older. What are the signs or symptoms? Symptoms of this condition usually happen when you move your head or your eyes in different directions. Symptoms may start suddenly, and usually last for less than a minute. They include:  Loss of balance and falling.  Feeling like you are spinning or moving.  Feeling like your surroundings are spinning or moving.  Nausea and vomiting.  Blurred vision.  Dizziness.  Involuntary eye movement (nystagmus). Symptoms can be mild and cause only minor problems, or they can be severe and interfere with daily life. Episodes of benign positional vertigo may return (recur) over time. Symptoms may improve over time. How is this diagnosed? This condition may be diagnosed based on:  Your medical history.  Physical exam of the head, neck, and ears.  Tests, such as: ? MRI. ? CT scan. ? Eye movement tests. Your health care provider may ask you to change positions quickly while he or she watches you for symptoms  of benign positional vertigo, such as nystagmus. Eye movement may be tested with a variety of exams that are designed to evaluate or stimulate vertigo. ? An electroencephalogram (EEG). This records electrical activity in your brain. ? Hearing tests. You may be referred to a health care provider who specializes in ear, nose, and throat (ENT) problems (otolaryngologist) or a provider who specializes in disorders of the nervous system (neurologist). How is this treated?  This condition may be treated in a session in which your health care provider moves your head in specific positions to adjust your inner ear back to normal. Treatment for this condition may take several sessions. Surgery may be needed in severe cases, but this is rare. In some cases, benign positional vertigo may resolve on its own in 2-4 weeks. Follow these instructions at home: Safety  Move slowly. Avoid sudden body or head movements or certain positions, as told by your health care provider.  Avoid driving until your health care provider says it is safe for you to do so.  Avoid operating heavy machinery until your health care provider says it is safe for you to do so.  Avoid doing any tasks that would be dangerous to you or others if vertigo occurs.  If you have trouble walking or keeping your balance, try using a cane for stability. If you feel dizzy or unstable, sit down right away.  Return to your normal activities as told by your health care provider. Ask your health care provider what activities are safe for you. General instructions  Take over-the-counter  and prescription medicines only as told by your health care provider.  Drink enough fluid to keep your urine pale yellow.  Keep all follow-up visits as told by your health care provider. This is important. Contact a health care provider if:  You have a fever.  Your condition gets worse or you develop new symptoms.  Your family or friends notice any  behavioral changes.  You have nausea or vomiting that gets worse.  You have numbness or a "pins and needles" sensation. Get help right away if you:  Have difficulty speaking or moving.  Are always dizzy.  Faint.  Develop severe headaches.  Have weakness in your legs or arms.  Have changes in your hearing or vision.  Develop a stiff neck.  Develop sensitivity to light. Summary  Vertigo is the feeling that you or your surroundings are moving when they are not. Benign positional vertigo is the most common form of vertigo.  The cause of this condition is not known. It may be caused by a disturbance in an area of the inner ear that helps your brain to sense movement and balance.  Symptoms include loss of balance and falling, feeling that you or your surroundings are moving, nausea and vomiting, and blurred vision.  This condition can be diagnosed based on symptoms, physical exam, and other tests, such as MRI, CT scan, eye movement tests, and hearing tests.  Follow safety instructions as told by your health care provider. You will also be told when to contact your health care provider in case of problems. This information is not intended to replace advice given to you by your health care provider. Make sure you discuss any questions you have with your health care provider. Document Released: 06/28/2006 Document Revised: 03/01/2018 Document Reviewed: 03/01/2018 Elsevier Patient Education  [place]. Atrial Fibrillation  Atrial fibrillation is a type of heartbeat that is irregular or fast (rapid). If you have this condition, your heart beats without any order. This makes it hard for your heart to pump blood in a normal way. Having this condition gives you more risk for stroke, heart failure, and other heart problems. Atrial fibrillation may start all of a sudden and then stop on its own, or it may become a long-lasting problem. What are the causes? This condition may be  caused by heart conditions, such as:  High blood pressure.  Heart failure.  Heart valve disease.  Heart surgery. Other causes include:  Pneumonia.  Obstructive sleep apnea.  Lung cancer.  Thyroid disease.  Drinking too much alcohol. Sometimes the cause is not known. What increases the risk? You are more likely to develop this condition if:  You smoke.  You are older.  You have diabetes.  You are overweight.  You have a family history of this condition.  You exercise often and hard. What are the signs or symptoms? Common symptoms of this condition include:  A feeling like your heart is beating very fast.  Chest pain.  Feeling short of breath.  Feeling light-headed or weak.  Getting tired easily. Follow these instructions at home: Medicines  Take over-the-counter and prescription medicines only as told by your doctor.  If your doctor gives you a blood-thinning medicine, take it exactly as told. Taking too much of it can cause bleeding. Taking too little of it does not protect you against clots. Clots can cause a stroke. Lifestyle      Do not use any tobacco products. These include cigarettes, chewing tobacco, and  e-cigarettes. If you need help quitting, ask your doctor.  Do not drink alcohol.  Do not drink beverages that have caffeine. These include coffee, soda, and tea.  Follow diet instructions as told by your doctor.  Exercise regularly as told by your doctor. General instructions  If you have a condition that causes breathing to stop for a short period of time (apnea), treat it as told by your doctor.  Keep a healthy weight. Do not use diet pills unless your doctor says they are safe for you. Diet pills may make heart problems worse.  Keep all follow-up visits as told by your doctor. This is important. Contact a doctor if:  You notice a change in the speed, rhythm, or strength of your heartbeat.  You are taking a blood-thinning medicine  and you see more bruising.  You get tired more easily when you move or exercise.  You have a sudden change in weight. Get help right away if:   You have pain in your chest or your belly (abdomen).  You have trouble breathing.  You have blood in your vomit, poop, or pee (urine).  You have any signs of a stroke. "BE FAST" is an easy way to remember the main warning signs: ? B - Balance. Signs are dizziness, sudden trouble walking, or loss of balance. ? E - Eyes. Signs are trouble seeing or a change in how you see. ? F - Face. Signs are sudden weakness or loss of feeling in the face, or the face or eyelid drooping on one side. ? A - Arms. Signs are weakness or loss of feeling in an arm. This happens suddenly and usually on one side of the body. ? S - Speech. Signs are sudden trouble speaking, slurred speech, or trouble understanding what people say. ? T - Time. Time to call emergency services. Write down what time symptoms started.  You have other signs of a stroke, such as: ? A sudden, very bad headache with no known cause. ? Feeling sick to your stomach (nausea). ? Throwing up (vomiting). ? Jerky movements you cannot control (seizure). These symptoms may be an emergency. Do not wait to see if the symptoms will go away. Get medical help right away. Call your local emergency services (911 in the [place] Do not drive yourself to the hospital. Summary  Atrial fibrillation is a type of heartbeat that is irregular or fast (rapid).  You are at higher risk of this condition if you smoke, are older, have diabetes, or are overweight.  Follow your doctor's instructions about medicines, diet, exercise, and follow-up visits.  Get help right away if you think that you have signs of a stroke. This information is not intended to replace advice given to you by your health care provider. Make sure you discuss any questions you have with your health care provider. Document Released: 06/29/2008  Document Revised: 11/24/2017 Document Reviewed: 11/11/2017 Elsevier Patient Education  2020 [employer].

## 2019-09-03 NOTE — Assessment & Plan Note (Signed)
BP at goal and does see nephrology and overall stable kidney function from prior. Taking metoprolol, amlodipine and lisinopril.

## 2019-09-03 NOTE — Assessment & Plan Note (Signed)
Has been present and relatively stable for several years. He will ask nephrology to recheck at upcoming visit. 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

## 2019-09-03 NOTE — Progress Notes (Signed)
Patient consent obtained. Irrigation with water and peroxide performed. Full view of tympanic membranes after procedure.  Patient tolerated procedure well.   

## 2019-09-03 NOTE — Progress Notes (Signed)
   Subjective:   Patient ID: Howard Green, male    DOB: 26-Apr-1956, 63 y.o.   MRN: [id number]  HPI The patient is a 63 YO man coming in for ER follow up (in for vertigo, found to have new A fib and worsening thrombocytosis, takes aspirin 81 mg daily for some time, has no insurance given conditions for several years, did have negative CT head ruling out stroke). He has had more episodes of vertigo this morning before even moving in bed. Denies fevers or chills. Denies nausea or vomiting. Denies abdominal pain. Denies ear pain or sinus congestion. Denies SOB or palpitations. Last EKG 2016. Sees nephrology every 6 months. Last visit here 2 years or more ago. Denies chest pains. Denies change in medications or missed doses recently. Tried meclizine yesterday but this just made him sleep and did not help the vertigo much.   PMH, [facility], social history reviewed and updated  Review of Systems  Constitutional: Positive for activity change and appetite change.  HENT: Negative.   Eyes: Negative.   Respiratory: Negative for cough, chest tightness and shortness of breath.   Cardiovascular: Negative for chest pain, palpitations and leg swelling.  Gastrointestinal: Negative for abdominal distention, abdominal pain, constipation, diarrhea, nausea and vomiting.  Musculoskeletal: Positive for arthralgias.  Skin: Negative.   Neurological: Positive for dizziness.  Psychiatric/Behavioral: Negative.     Objective:  Physical Exam Constitutional:      Appearance: He is well-developed.  HENT:     Head: Normocephalic and atraumatic.     Comments: Right ear with wax, s/p irrigation no infection and TM normal bilaterally    Left Ear: Tympanic membrane normal.  Neck:     Musculoskeletal: Normal range of motion.  Cardiovascular:     Rate and Rhythm: Normal rate and regular rhythm.  Pulmonary:     Effort: Pulmonary effort is normal. No respiratory distress.     Breath sounds: Normal breath sounds. No wheezing  or rales.  Abdominal:     General: Bowel sounds are normal. There is no distension.     Palpations: Abdomen is soft.     Tenderness: There is no abdominal tenderness. There is no rebound.  Skin:    General: Skin is warm and dry.  Neurological:     Mental Status: He is alert and oriented to person, place, and time.     Coordination: Coordination normal.     Vitals:   09/03/19 1451  BP: 140/70  Pulse: 88  Temp: 98.7 F (37.1 C)  TempSrc: Oral  SpO2: 97%  Weight: 255 lb (115.7 kg)  Height: 6' (1.829 m)    This visit occurred during the SARS-CoV-2 public health emergency.  Safety protocols were in place, including screening questions prior to the visit, additional usage of staff PPE, and extensive cleaning of exam room while observing appropriate contact time as indicated for disinfecting solutions.   Assessment & Plan:

## 2019-09-03 NOTE — Assessment & Plan Note (Signed)
Already on beta-blocker. Given low health care he could have had for some time. Last EKG 2016 sinus. No acute symptoms of a fib. Is on aspirin 81 mg daily already which is adequate given chads score 1. He will make visit with cardiology but he may want to wait for more treatment etc until 89 and on medicare so he will have insurance.

## 2019-09-03 NOTE — Assessment & Plan Note (Signed)
No signs of stroke, could be bppv. Given epley maneuver instructions. Can use meclizine prn. Right ear cleaned and this may help. No signs of infection.

## 2019-09-03 NOTE — Telephone Encounter (Signed)
  Pt. Reports he was seen in ED Saturday for vertigo. Was checked for stroke activity and was cleared. Reports his dizziness/vertigo "has gotten worse." Requests to be seen sooner than 09/10/19. Warm transfer to [facility] in the practice. Reason for Disposition . [1] MODERATE dizziness (e.g., vertigo; feels very unsteady, interferes with normal activities) AND [2] has been evaluated by physician for this  Answer Assessment - Initial Assessment Questions 1. DESCRIPTION: "Describe your dizziness."     Spinning 2. VERTIGO: "Do you feel like either you or the room is spinning or tilting?"      Yes 3. LIGHTHEADED: "Do you feel lightheaded?" (e.g., somewhat faint, woozy, weak upon standing)     Yes 4. SEVERITY: "How bad is it?"  "Can you walk?"   - MILD - Feels unsteady but walking normally.   - MODERATE - Feels very unsteady when walking, but not falling; interferes with normal activities (e.g., school, work) .   - SEVERE - Unable to walk without falling (requires assistance).     Moderate 5. ONSET:  "When did the dizziness begin?"     Saturday - seen in ED 6. AGGRAVATING FACTORS: "Does anything make it worse?" (e.g., standing, change in head position)     Movement 7. CAUSE: "What do you think is causing the dizziness?"     Unsure 8. RECURRENT SYMPTOM: "Have you had dizziness before?" If so, ask: "When was the last time?" "What happened that time?"     No 9. OTHER SYMPTOMS: "Do you have any other symptoms?" (e.g., headache, weakness, numbness, vomiting, earache)     No 10. PREGNANCY: "Is there any chance you are pregnant?" "When was your last menstrual period?"       n/a  Protocols used: DIZZINESS - VERTIGO-A-AH

## 2019-09-03 NOTE — Telephone Encounter (Signed)
Noted thanks °

## 2019-09-04 LAB — PATHOLOGIST SMEAR REVIEW

## 2019-09-10 ENCOUNTER — Inpatient Hospital Stay: Payer: Self-pay | Admitting: Internal Medicine

## 2019-09-11 ENCOUNTER — Ambulatory Visit (HOSPITAL_COMMUNITY): Payer: Self-pay | Admitting: Physician Assistant

## 2020-09-19 ENCOUNTER — Ambulatory Visit (INDEPENDENT_AMBULATORY_CARE_PROVIDER_SITE_OTHER): Payer: Medicare HMO | Admitting: Internal Medicine

## 2020-09-19 ENCOUNTER — Other Ambulatory Visit: Payer: Self-pay

## 2020-09-19 ENCOUNTER — Encounter: Payer: Self-pay | Admitting: Internal Medicine

## 2020-09-19 VITALS — BP 158/82 | HR 96 | Temp 99.2°F | Ht 72.0 in | Wt 242.8 lb

## 2020-09-19 DIAGNOSIS — I10 Essential (primary) hypertension: Secondary | ICD-10-CM

## 2020-09-19 DIAGNOSIS — I4819 Other persistent atrial fibrillation: Secondary | ICD-10-CM

## 2020-09-19 DIAGNOSIS — D75839 Thrombocytosis, unspecified: Secondary | ICD-10-CM

## 2020-09-19 DIAGNOSIS — M10271 Drug-induced gout, right ankle and foot: Secondary | ICD-10-CM

## 2020-09-19 LAB — CBC
HCT: 39.4 % (ref 39.0–52.0)
Hemoglobin: 13.1 g/dL (ref 13.0–17.0)
MCHC: 33.3 g/dL (ref 30.0–36.0)
MCV: 79.6 fl (ref 78.0–100.0)
Platelets: 1267 10*3/uL (ref 150.0–400.0)
RBC: 4.95 Mil/uL (ref 4.22–5.81)
RDW: 15.7 % — ABNORMAL HIGH (ref 11.5–15.5)
WBC: 18.5 10*3/uL (ref 4.0–10.5)

## 2020-09-19 LAB — COMPREHENSIVE METABOLIC PANEL
ALT: 10 U/L (ref 0–53)
AST: 12 U/L (ref 0–37)
Albumin: 4.1 g/dL (ref 3.5–5.2)
Alkaline Phosphatase: 63 U/L (ref 39–117)
BUN: 22 mg/dL (ref 6–23)
CO2: 22 mEq/L (ref 19–32)
Calcium: 9.3 mg/dL (ref 8.4–10.5)
Chloride: 104 mEq/L (ref 96–112)
Creatinine, Ser: 1.85 mg/dL — ABNORMAL HIGH (ref 0.40–1.50)
GFR: 37.78 mL/min — ABNORMAL LOW (ref >60.00)
Glucose, Bld: 108 mg/dL — ABNORMAL HIGH (ref 70–99)
Potassium: 3.7 mEq/L (ref 3.5–5.1)
Sodium: 136 mEq/L (ref 135–145)
Total Bilirubin: 1 mg/dL (ref 0.2–1.2)
Total Protein: 7.7 g/dL (ref 6.0–8.3)

## 2020-09-19 LAB — LIPID PANEL
Cholesterol: 129 mg/dL (ref 0–200)
HDL: 33.1 mg/dL — ABNORMAL LOW (ref >39.00)
LDL Cholesterol: 80 mg/dL (ref 0–99)
NonHDL: 95.79
Total CHOL/HDL Ratio: 4
Triglycerides: 78 mg/dL (ref 0.0–149.0)
VLDL: 15.6 mg/dL (ref 0.0–40.0)

## 2020-09-19 LAB — HEMOGLOBIN A1C: Hgb A1c MFr Bld: 5.6 % (ref 4.6–6.5)

## 2020-09-19 MED ORDER — PREDNISONE 20 MG PO TABS: 40.0000 mg | ORAL_TABLET | Freq: Every day | ORAL | 0 refills | Status: AC

## 2020-09-19 NOTE — Patient Instructions (Signed)
We have sent in prednisone to take 2 pills daily for 1 week.  We are checking labs today and will call you back about the results.

## 2020-09-19 NOTE — Progress Notes (Signed)
   Subjective:   Patient ID: Howard Green, male    DOB: [birth year], 64 y.o.   MRN: [id number]  HPI The patient is a 64 YO man coming in for concerns about gout with multiple episodes recently in different joints. They will come and last and then go away. Used to be he had a flare up in the spring which lasted 4-6 weeks and then none the rest of the year. Now more often than not he has symptoms. Currently with swelling/redness in the right wrist and hand and pain. Overall it is improving from onset. Very severe at onset and could not move well. Still very stiff and he cannot use as normal. Prior CKD and no follow up recently due to lack of insurance. Now has medicare and wants to get his health better controlled. Did not follow up with cardiology about new a fib last year. Not taking the beta blocker currently.   Review of Systems  Constitutional: Negative.   HENT: Negative.   Eyes: Negative.   Respiratory: Negative for cough, chest tightness and shortness of breath.   Cardiovascular: Negative for chest pain, palpitations and leg swelling.  Gastrointestinal: Negative for abdominal distention, abdominal pain, constipation, diarrhea, nausea and vomiting.  Musculoskeletal: Positive for arthralgias, joint swelling and myalgias.  Skin: Negative.   Neurological: Negative.   Psychiatric/Behavioral: Negative.     Objective:  Physical Exam Constitutional:      Appearance: He is well-developed and well-nourished.  HENT:     Head: Normocephalic and atraumatic.  Eyes:     Extraocular Movements: EOM normal.  Cardiovascular:     Rate and Rhythm: Normal rate. Rhythm irregular.  Pulmonary:     Effort: Pulmonary effort is normal. No respiratory distress.     Breath sounds: Normal breath sounds. No wheezing or rales.  Abdominal:     General: Bowel sounds are normal. There is no distension.     Palpations: Abdomen is soft.     Tenderness: There is no abdominal tenderness. There is no rebound.   Musculoskeletal:        General: Swelling and tenderness present. No edema.     Cervical back: Normal range of motion.     Comments: Swelling and redness and tenderness MCP joints   Skin:    General: Skin is warm and dry.  Neurological:     Mental Status: He is alert and oriented to person, place, and time.     Coordination: Coordination normal.  Psychiatric:        Mood and Affect: Mood and affect normal.     Vitals:   09/19/20 1407  BP: (!) 158/82  Pulse: 96  Temp: 99.2 F (37.3 C)  Weight: 242 lb 12.8 oz (110.1 kg)  Height: 6' (1.829 m)    This visit occurred during the SARS-CoV-2 public health emergency.  Safety protocols were in place, including screening questions prior to the visit, additional usage of staff PPE, and extensive cleaning of exam room while observing appropriate contact time as indicated for disinfecting solutions.   Assessment & Plan:

## 2020-09-19 NOTE — Assessment & Plan Note (Addendum)
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 Checking CMP. He has seen nephrology in the past and unknown when last visit was. Currently admits to taking only amlodipine although at last visit was on metoprolol, lisinopril and amlodipine.

## 2020-09-19 NOTE — Assessment & Plan Note (Signed)
Did not discuss in detail today but noted again irreg irreg on exam. Not taking beta blocker currently. Did not follow up with cardiology. Will address acute today and bring him back for physical to discuss further as he likely qualifies for NOAC at this time.

## 2020-09-19 NOTE — Assessment & Plan Note (Signed)
Rx prednisone. Checking labs to exclude other arthopathies today with RF and ANA panel with reflex. Checking CBC and CMP.

## 2020-09-22 ENCOUNTER — Other Ambulatory Visit: Payer: Self-pay | Admitting: Internal Medicine

## 2020-09-22 DIAGNOSIS — D75839 Thrombocytosis, unspecified: Secondary | ICD-10-CM

## 2020-09-22 DIAGNOSIS — D72829 Elevated white blood cell count, unspecified: Secondary | ICD-10-CM

## 2020-09-23 LAB — ANA,IFA RA DIAG PNL W/RFLX TIT/PATN
Anti Nuclear Antibody (ANA): NEGATIVE
Cyclic Citrullin Peptide Ab: <16 UNITS
Rheumatoid fact SerPl-aCnc: <14 IU/mL (ref <14)

## 2020-10-01 ENCOUNTER — Telehealth: Payer: Self-pay | Admitting: Hematology and Oncology

## 2020-10-01 NOTE — Telephone Encounter (Signed)
Received a new hem referral from Dr. Okey Dupre for thrombocytosis/leukocytosis. Pt has been cld and scheduled to see Dr. Al Pimple on 1/4 at 3pm. Pt aware to arrive 20 minutes early.

## 2020-10-07 ENCOUNTER — Encounter: Payer: Medicare HMO | Admitting: Hematology and Oncology

## 2020-10-07 ENCOUNTER — Telehealth: Payer: Self-pay | Admitting: Hematology and Oncology

## 2020-10-07 ENCOUNTER — Other Ambulatory Visit: Payer: Medicare HMO

## 2020-10-07 NOTE — Telephone Encounter (Signed)
Rescheduled appointment per 1/3 provider msg. Spoke to patient who is aware of updated appointment date and time.  

## 2020-10-07 NOTE — Telephone Encounter (Signed)
Rescheduled appointments per 1/4 provider msg. Spoke to patient who said they would rather have an in-person appointment instead of virtual. Rescheduled appointment accordingly. Patient is aware of appt. Date and time.

## 2020-10-08 ENCOUNTER — Encounter: Payer: Medicare HMO | Admitting: Hematology and Oncology

## 2020-10-08 ENCOUNTER — Other Ambulatory Visit: Payer: Medicare HMO

## 2020-10-15 ENCOUNTER — Inpatient Hospital Stay: Payer: Medicare HMO | Attending: Hematology and Oncology | Admitting: Hematology and Oncology

## 2020-10-15 ENCOUNTER — Telehealth: Payer: Self-pay | Admitting: Hematology and Oncology

## 2020-10-15 ENCOUNTER — Other Ambulatory Visit: Payer: Self-pay

## 2020-10-15 ENCOUNTER — Telehealth: Payer: Self-pay

## 2020-10-15 ENCOUNTER — Inpatient Hospital Stay: Payer: Medicare HMO

## 2020-10-15 ENCOUNTER — Encounter: Payer: Self-pay | Admitting: Hematology and Oncology

## 2020-10-15 VITALS — BP 170/112 | HR 90 | Temp 98.1°F | Resp 18 | Ht 72.0 in | Wt 246.7 lb

## 2020-10-15 DIAGNOSIS — D75839 Thrombocytosis, unspecified: Secondary | ICD-10-CM

## 2020-10-15 DIAGNOSIS — Z7982 Long term (current) use of aspirin: Secondary | ICD-10-CM | POA: Diagnosis not present

## 2020-10-15 DIAGNOSIS — Z801 Family history of malignant neoplasm of trachea, bronchus and lung: Secondary | ICD-10-CM | POA: Insufficient documentation

## 2020-10-15 DIAGNOSIS — Z79899 Other long term (current) drug therapy: Secondary | ICD-10-CM | POA: Insufficient documentation

## 2020-10-15 DIAGNOSIS — D72829 Elevated white blood cell count, unspecified: Secondary | ICD-10-CM

## 2020-10-15 DIAGNOSIS — I10 Essential (primary) hypertension: Secondary | ICD-10-CM | POA: Diagnosis not present

## 2020-10-15 DIAGNOSIS — Z833 Family history of diabetes mellitus: Secondary | ICD-10-CM | POA: Insufficient documentation

## 2020-10-15 DIAGNOSIS — E079 Disorder of thyroid, unspecified: Secondary | ICD-10-CM | POA: Diagnosis not present

## 2020-10-15 DIAGNOSIS — D473 Essential (hemorrhagic) thrombocythemia: Secondary | ICD-10-CM | POA: Diagnosis not present

## 2020-10-15 LAB — CBC WITH DIFFERENTIAL/PLATELET
Abs Immature Granulocytes: 0.05 10*3/uL (ref 0.00–0.07)
Basophils Absolute: 0.1 10*3/uL (ref 0.0–0.1)
Basophils Relative: 1 %
Eosinophils Absolute: 0.2 10*3/uL (ref 0.0–0.5)
Eosinophils Relative: 2 %
HCT: 42.6 % (ref 39.0–52.0)
Hemoglobin: 13.7 g/dL (ref 13.0–17.0)
Immature Granulocytes: 0 %
Lymphocytes Relative: 17 %
Lymphs Abs: 2 10*3/uL (ref 0.7–4.0)
MCH: 26.5 pg (ref 26.0–34.0)
MCHC: 32.2 g/dL (ref 30.0–36.0)
MCV: 82.4 fL (ref 80.0–100.0)
Monocytes Absolute: 0.9 10*3/uL (ref 0.1–1.0)
Monocytes Relative: 8 %
Neutro Abs: 8.5 10*3/uL — ABNORMAL HIGH (ref 1.7–7.7)
Neutrophils Relative %: 72 %
Platelets: 1173 10*3/uL (ref 150–400)
RBC: 5.17 MIL/uL (ref 4.22–5.81)
RDW: 15.1 % (ref 11.5–15.5)
WBC: 11.7 10*3/uL — ABNORMAL HIGH (ref 4.0–10.5)
nRBC: 0 % (ref 0.0–0.2)

## 2020-10-15 NOTE — Telephone Encounter (Signed)
CRITICAL VALUE STICKER  CRITICAL VALUE: Platelets = [phone]  RECEIVER (on-site recipient of call): Yetta Glassman, CMA  DATE & TIME NOTIFIED: 10/15/20 at 4:20pm  MESSENGER (representative from lab): Suanne Marker  MD NOTIFIED: Dr. Chryl Heck  TIME OF NOTIFICATION: 10/15/20 at 4:23pm  RESPONSE: Notification given to Dr. Chryl Heck and she advised she has ordered some additional testing to confirm dx.

## 2020-10-15 NOTE — Telephone Encounter (Signed)
Scheduled appointment per 1/12 los. Spoke to patient who is aware of appointment date and time.

## 2020-10-15 NOTE — Progress Notes (Signed)
[place] NOTE  Patient Care Team: [name], MD as PCP - General (Internal Medicine)  CHIEF COMPLAINTS/PURPOSE OF CONSULTATION:  Thrombocytosis  ASSESSMENT & PLAN:  No problem-specific Assessment & Plan notes found for this encounter.  Orders Placed This Encounter  Procedures  . CBC with Differential/Platelet    Standing Status:   Standing    Number of Occurrences:   22    Standing Expiration Date:   10/15/2021  . Pathologist smear review    Extreme thrombocytosis    Standing Status:   Future    Number of Occurrences:   1    Standing Expiration Date:   10/15/2021  . JAK2 V617F, w Reflex to CALR/E12/MPL    Standing Status:   Future    Number of Occurrences:   1    Standing Expiration Date:   10/15/2021  . BCR-ABL    With RT-PCR technique    Standing Status:   Standing    Number of Occurrences:   22    Standing Expiration Date:   10/15/2021   This is a very pleasant 65 year old male patient with past medical history significant for hypertension, gouty arthritis questionable referred to hematology for evaluation of persistent and extreme thrombocytosis.  He was also found to have leukocytosis at the time which is likely from prednisone use, this has significantly improved. Review of systems without any major concerns except for migraine creatinine test that patient describes as attributes to gout. No hyperviscosity symptoms or B symptoms. Physical examination today quite unremarkable, no palpable lymphadenopathy or hepatosplenomegaly. 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  I recommended that we proceed with myeloproliferative work-up given the extreme thrombocytosis, discussed risk of both bleeding and clotting with diabetes levels of platelet count. He is already on aspirin which I encouraged to continue.  He has had no personal history of thromboembolic episodes. We have  briefly discussed about cytoreductive agents such as Hydrea if he has evidence of essential thrombocytosis from laboratory work-up. He was encouraged to go to the nearest hospital with any evidence of bleeding, intractable headaches given paradoxical bleeding that can be noted with extreme thrombocytosis Return to clinic in 1 week to review labs and to discuss any additional recommendations. Encouraged him to talk to his PCP about his arthritis which may not necessarily be gout and may require further evaluation  HISTORY OF PRESENTING ILLNESS:   Howard Green 65 y.o. male is here because of thrombocytosis and leukocytosis.  This is a very pleasant 65 year old male patient with past medical history significant for hypertension, gout who recently went to see his primary care physician with uncontrolled joint pains which he attributes to gout. He tells me that he has had gout for many years but lately has been out of control and he was taking prednisone 20 mg daily but had uncontrolled symptoms. He went to see his doctor because his gouty pain progressed from 1 joint to another and he was given 1 week of 40 mg of prednisone daily and also had some labs drawn on the same day. Besides complains about gout and arthralgias he absolutely denies any unusual fatigue, sluggishness, headaches, difficulty breathing, unexplained bleeding, chest pain, chest pressure, history of blood clots, cardiac events, erythromelalgia or aqua genic pruritus. 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 He has had a lot of stress given mental health issues and his wife but otherwise has been  doing really well overall. He has completed his course of prednisone about 2 weeks ago. He denies any change in bowel habits or urinary habits. Rest of the review of systems negative except as mentioned above. Rest of the pertinent 10 point ROS reviewed and negative except as mentioned.  REVIEW OF SYSTEMS:    Constitutional:  Denies fevers, chills or abnormal night sweats Eyes: Denies blurriness of vision, double vision or watery eyes Ears, nose, mouth, throat, and face: Denies mucositis or sore throat Respiratory: Denies cough, dyspnea or wheezes Cardiovascular: Denies palpitation, chest discomfort or lower extremity swelling Gastrointestinal:  Denies nausea, heartburn or change in bowel habits Skin: Denies abnormal skin rashes Lymphatics: Denies new lymphadenopathy or easy bruising Neurological:Denies numbness, tingling or new weaknesses Behavioral/Psych: Mood is stable, no new changes  All other systems were reviewed with the patient and are negative.  MEDICAL HISTORY:  Past Medical History:  Diagnosis Date  . Allergy    seasonal like hayfever   . Bleeding of eye, bilateral   . Hypertension   . Thyroid disease    benign cyst removed 15 years ago    SURGICAL HISTORY: Past Surgical History:  Procedure Laterality Date  . THYROID SURGERY      SOCIAL HISTORY: Social History   Socioeconomic History  . Marital status: Widowed    Spouse name: Not on file  . Number of children: Not on file  . Years of education: Not on file  . Highest education level: Not on file  Occupational History  . Not on file  Tobacco Use  . Smoking status: Never Smoker  . Smokeless tobacco: Never Used  Vaping Use  . Vaping Use: Former  Substance and Sexual Activity  . Alcohol use: No    Alcohol/week: 0.0 standard drinks  . Drug use: No  . Sexual activity: Not on file  Other Topics Concern  . Not on file  Social History Narrative   Lives with wife in a one story home.  Has 4 children.     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      Social Determinants of Health   Financial Resource Strain: Not on file  Food Insecurity: Not on file  Transportation Needs: Not on file  Physical Activity: Not on file  Stress: Not on file  Social Connections: Not on file  Intimate Partner Violence: Not on file     FAMILY HISTORY: Family History  Problem Relation Age of Onset  . Diabetes Mother   . Other Mother        Good Pastures disease  . Lung cancer Father        passed 19  . Healthy Brother   . Healthy Sister   . Healthy Son   . Healthy Daughter   . Colon cancer Neg Hx   . Rectal cancer Neg Hx   . Stomach cancer Neg Hx     ALLERGIES:  has No Known Allergies.  MEDICATIONS:  Current Outpatient Medications  Medication Sig Dispense Refill  . amLODipine (NORVASC) 10 MG tablet Take 10 mg by mouth daily.   12  . aspirin 81 MG tablet Take 81 mg by mouth daily.     No current facility-administered medications for this visit.     PHYSICAL EXAMINATION: ECOG PERFORMANCE STATUS: 0 - Asymptomatic  Vitals:   10/15/20 1455  BP: (!) 170/112  Pulse: 90  Resp: 18  Temp: 98.1 F (36.7 C)  SpO2: 100%   Filed Weights  10/15/20 1455  Weight: 246 lb 11.2 oz (111.9 kg)    GENERAL:alert, no distress and comfortable SKIN: skin color, texture, turgor are normal, no rashes or significant lesions EYES: normal, conjunctiva are pink and non-injected, sclera clear OROPHARYNX:no exudate, no erythema and lips, buccal mucosa, and tongue normal  NECK: supple, thyroid normal size, non-tender, without nodularity LYMPH:  no palpable lymphadenopathy in the cervical, axillary or inguinal LUNGS: clear to auscultation and percussion with normal breathing effort HEART: regular rate & rhythm and no murmurs and no lower extremity edema ABDOMEN:abdomen soft, non-tender and normal bowel sounds. No hepatosplenomegaly Musculoskeletal:no cyanosis of digits and no clubbing  PSYCH: alert & oriented x 3 with fluent speech NEURO: no focal motor/sensory deficits  LABORATORY DATA:  I have reviewed the data as listed Lab Results  Component Value Date   WBC 11.7 (H) 10/15/2020   HGB 13.7 10/15/2020   HCT 42.6 10/15/2020   MCV 82.4 10/15/2020   PLT 1,173 (HH) 10/15/2020     Chemistry      Component  Value Date/Time   NA 136 09/19/2020 1437   NA 139 06/14/2018 0000   K 3.7 09/19/2020 1437   CL 104 09/19/2020 1437   CO2 22 09/19/2020 1437   BUN 22 09/19/2020 1437   BUN 21 06/14/2018 0000   CREATININE 1.85 (H) 09/19/2020 1437   GLU 98 06/14/2018 0000      Component Value Date/Time   CALCIUM 9.3 09/19/2020 1437   ALKPHOS 63 09/19/2020 1437   AST 12 09/19/2020 1437   ALT 10 09/19/2020 1437   BILITOT 1.0 09/19/2020 1437     Labs from 5 yrs ago show persistent leukocytosis, thrombocytosis which has progressed in the past 5 yrs. Most recent CBC continue to show extreme thrombocytosis.  RADIOGRAPHIC STUDIES: I have personally reviewed the radiological images as listed and agreed with the findings in the report. No results found.  All questions were answered. The patient knows to call the clinic with any problems, questions or concerns. I spent 45 minutes in the care of this patient including history and physical, review of records, counseling and coordination of care.    Benay Pike, MD 10/15/2020 4:08 PM

## 2020-10-16 ENCOUNTER — Telehealth: Payer: Self-pay | Admitting: Hematology and Oncology

## 2020-10-16 ENCOUNTER — Encounter: Payer: Self-pay | Admitting: Hematology and Oncology

## 2020-10-16 LAB — PATHOLOGIST SMEAR REVIEW

## 2020-10-16 NOTE — Telephone Encounter (Signed)
Rescheduled appointment per MD schedule change. Spoke to patient who is aware of updated appointment date and time.

## 2020-10-16 NOTE — Addendum Note (Signed)
Addended by: Adaline Sill on: 10/16/2020 05:11 PM   Modules accepted: Level of Service

## 2020-10-23 ENCOUNTER — Inpatient Hospital Stay: Payer: Medicare HMO | Admitting: Hematology and Oncology

## 2020-10-23 ENCOUNTER — Telehealth: Payer: Self-pay | Admitting: Hematology and Oncology

## 2020-10-23 NOTE — Telephone Encounter (Signed)
Rescheduled appointment time per provider on call schedule. Spoke to patient who is aware of updated appointment time.

## 2020-10-24 ENCOUNTER — Ambulatory Visit: Payer: Medicare HMO | Admitting: Hematology and Oncology

## 2020-10-27 LAB — JAK2 (INCLUDING V617F AND EXON 12), MPL,& CALR-NEXT GEN SEQ

## 2020-10-27 LAB — BCR/ABL

## 2020-10-29 ENCOUNTER — Inpatient Hospital Stay: Payer: Medicare HMO | Admitting: Hematology and Oncology

## 2020-10-29 ENCOUNTER — Other Ambulatory Visit: Payer: Self-pay

## 2020-10-29 ENCOUNTER — Encounter: Payer: Self-pay | Admitting: Hematology and Oncology

## 2020-10-29 ENCOUNTER — Ambulatory Visit: Payer: Medicare HMO | Admitting: Hematology and Oncology

## 2020-10-29 VITALS — BP 154/87 | HR 89 | Temp 98.3°F | Resp 18 | Ht 72.0 in | Wt 246.9 lb

## 2020-10-29 DIAGNOSIS — I10 Essential (primary) hypertension: Secondary | ICD-10-CM | POA: Diagnosis not present

## 2020-10-29 DIAGNOSIS — Z833 Family history of diabetes mellitus: Secondary | ICD-10-CM | POA: Diagnosis not present

## 2020-10-29 DIAGNOSIS — Z7982 Long term (current) use of aspirin: Secondary | ICD-10-CM | POA: Diagnosis not present

## 2020-10-29 DIAGNOSIS — D473 Essential (hemorrhagic) thrombocythemia: Secondary | ICD-10-CM

## 2020-10-29 DIAGNOSIS — Z79899 Other long term (current) drug therapy: Secondary | ICD-10-CM | POA: Diagnosis not present

## 2020-10-29 DIAGNOSIS — Z801 Family history of malignant neoplasm of trachea, bronchus and lung: Secondary | ICD-10-CM | POA: Diagnosis not present

## 2020-10-29 DIAGNOSIS — E079 Disorder of thyroid, unspecified: Secondary | ICD-10-CM | POA: Diagnosis not present

## 2020-10-29 NOTE — Progress Notes (Signed)
[facility]  Patient Care Team: [name], MD as PCP - General (Internal Medicine)  CHIEF COMPLAINTS/PURPOSE OF CONSULTATION:  Thrombocytosis  ASSESSMENT & PLAN:  No problem-specific Assessment & Plan notes found for this encounter.  No orders of the defined types were placed in this encounter. #1.  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  He understands that there is increased risk of thrombocytosis with essential thrombocytosis and there is a small risk of transformation into acute myeloid leukemia. We have discussed about cytoreductive agents such as Hydrea since the platelet count is over 1 million and age greater than 22 and intermediate risk. He will continue aspirin 81 mg po daily Discussed about side effects of hydrea including but not limited to fatigue, leg swelling, skin rash, mouth ulcers, cytopenias and questionable carcinogenicity, He is agreeable to try it. We have also discussed about paradoxical risk of bleeding with extreme thrombocytosis and he should go to the nearest hospital with any evidence of bleeding, intractable headaches .  PLAN  Start Hydrea 500 mg daily Aspirin 81 mg once a daily. RTC in 4 weeks.  e.  HISTORY OF PRESENTING ILLNESS:   Howard Green 65 y.o. male is here because of thrombocytosis and leukocytosis.  This is a very pleasant 65 year old male patient with past medical history significant for hypertension, gout referred to hematology for evaluation of extreme thrombocytosis.  MPN work-up showed CAL reticulin mutation.    Interim history  He is here for follow-up.  He  feels well, very active with his horses.  He notices some easy bruising but no evidence of bleeding.  No change in breathing, bowel habits or urinary habits.  No new neurological complaints.  Rest of the pertinent 10 point ROS reviewed and negative except as mentioned.  REVIEW OF SYSTEMS:    Constitutional: Denies fevers, chills or abnormal night sweats Eyes: Denies blurriness of vision, double vision or watery eyes Ears, nose, mouth, throat, and face: Denies mucositis or sore throat Respiratory: Denies cough, dyspnea or wheezes Cardiovascular: Denies palpitation, chest discomfort or lower extremity swelling Gastrointestinal:  Denies nausea, heartburn or change in bowel habits Skin: Denies abnormal skin rashes Lymphatics: Denies new lymphadenopathy or easy bruising Neurological:Denies numbness, tingling or new weaknesses Behavioral/Psych: Mood is stable, no new changes  All other systems were reviewed with the patient and are negative.  MEDICAL HISTORY:  Past Medical History:  Diagnosis Date  . Allergy    seasonal like hayfever   . Bleeding of eye, bilateral   . Hypertension   . Thyroid disease    benign cyst removed 15 years ago    SURGICAL HISTORY: Past Surgical History:  Procedure Laterality Date  . THYROID SURGERY      SOCIAL HISTORY: Social History   Socioeconomic History  . Marital status: Widowed    Spouse name: Not on file  . Number of children: Not on file  . Years of education: Not on file  . Highest education level: Not on file  Occupational History  . Not on file  Tobacco Use  . Smoking status: Never Smoker  . Smokeless tobacco: Never Used  Vaping Use  . Vaping Use: Former  Substance and Sexual Activity  . Alcohol use:  No    Alcohol/week: 0.0 standard drinks  . Drug use: No  . Sexual activity: Not on file  Other Topics Concern  . Not on file  Social History Narrative   Lives with wife in a one story home.  Has 4 children.     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      Social Determinants of Health   Financial Resource Strain: Not on file  Food Insecurity: Not on file  Transportation Needs: Not on file  Physical Activity: Not on file  Stress: Not on file  Social Connections: Not on file  Intimate Partner Violence: Not on file    FAMILY HISTORY: Family History  Problem Relation Age of Onset  . Diabetes Mother   . Other Mother        Good Pastures disease  . Lung cancer Father        passed 71  . Healthy Brother   . Healthy Sister   . Healthy Son   . Healthy Daughter   . Colon cancer Neg Hx   . Rectal cancer Neg Hx   . Stomach cancer Neg Hx     ALLERGIES:  has No Known Allergies.  MEDICATIONS:  Current Outpatient Medications  Medication Sig Dispense Refill  . amLODipine (NORVASC) 10 MG tablet Take 10 mg by mouth daily.   12  . aspirin 81 MG tablet Take 81 mg by mouth daily.    [name] lisinopril (ZESTRIL) 30 MG tablet Take 1 tablet by mouth daily. (Patient not taking: Reported on 10/29/2020)     No current facility-administered medications for this visit.     PHYSICAL EXAMINATION: ECOG PERFORMANCE STATUS: 0 - Asymptomatic  Vitals:   10/29/20 1237  BP: (!) 154/87  Pulse: 89  Resp: 18  Temp: 98.3 F (36.8 C)  SpO2: 100%   Filed Weights   10/29/20 1237  Weight: 246 lb 14.4 oz (112 kg)    GENERAL:alert, no distress and comfortable SKIN: skin color, texture, turgor are normal, no rashes or significant lesions EYES: normal, conjunctiva are pink and non-injected, sclera clear OROPHARYNX:no exudate, no erythema and lips, buccal mucosa, and tongue normal  NECK: supple, thyroid normal size, non-tender, without nodularity LYMPH:  no palpable lymphadenopathy in the cervical, axillary or inguinal LUNGS: clear to auscultation and percussion with normal breathing effort HEART: regular rate & rhythm and no murmurs and no lower extremity edema ABDOMEN:abdomen soft, non-tender and normal bowel sounds.  No hepatosplenomegaly Musculoskeletal:no cyanosis of digits and no clubbing  PSYCH: alert & oriented x 3 with fluent speech NEURO: no focal motor/sensory deficits  LABORATORY DATA:  I have reviewed the data as listed Lab Results  Component Value Date   WBC 11.7 (H) 10/15/2020   HGB 13.7 10/15/2020   HCT 42.6 10/15/2020   MCV 82.4 10/15/2020   PLT 1,173 (HH) 10/15/2020     Chemistry      Component Value Date/Time   NA 136 09/19/2020 1437   NA 139 06/14/2018 0000   K 3.7 09/19/2020 1437   CL 104 09/19/2020 1437   CO2 22 09/19/2020 1437   BUN 22 09/19/2020 1437   BUN 21 06/14/2018 0000   CREATININE 1.85 (H) 09/19/2020 1437   GLU 98 06/14/2018 0000      Component Value Date/Time   CALCIUM 9.3 09/19/2020 1437   ALKPHOS 63 09/19/2020 1437   AST 12 09/19/2020 1437   ALT 10 09/19/2020 1437   BILITOT 1.0 09/19/2020 1437  Labs from 5 yrs ago show persistent leukocytosis, thrombocytosis which has progressed in the past 5 yrs.  MPN work-up showed CAL reticulin mutation.  RADIOGRAPHIC STUDIES: I have personally reviewed the radiological images as listed and agreed with the findings in the report. No results found.  All questions were answered. The patient knows to call the clinic with any problems, questions or concerns. I spent 65minutes in the care of this patient including history and physical, review of records, counseling and coordination of care.    Benay Pike, MD 10/29/2020 1:00 PM

## 2020-10-30 ENCOUNTER — Encounter: Payer: Self-pay | Admitting: Hematology and Oncology

## 2020-10-30 ENCOUNTER — Telehealth: Payer: Self-pay | Admitting: Hematology and Oncology

## 2020-10-30 MED ORDER — HYDROXYUREA 500 MG PO CAPS: 500.0000 mg | ORAL_CAPSULE | Freq: Every day | ORAL | 0 refills | Status: DC

## 2020-10-30 NOTE — Telephone Encounter (Signed)
Scheduled appointments per 1/26 los. Called patient, no answer. No voicemail set up . Will mail updated calendar to patient.

## 2020-11-07 ENCOUNTER — Other Ambulatory Visit: Payer: Self-pay

## 2020-11-07 ENCOUNTER — Ambulatory Visit (INDEPENDENT_AMBULATORY_CARE_PROVIDER_SITE_OTHER): Payer: Medicare HMO | Admitting: Internal Medicine

## 2020-11-07 ENCOUNTER — Encounter: Payer: Self-pay | Admitting: Internal Medicine

## 2020-11-07 VITALS — BP 132/68 | HR 104 | Temp 98.7°F | Resp 18 | Ht 72.0 in | Wt 248.6 lb

## 2020-11-07 DIAGNOSIS — N183 Chronic kidney disease, stage 3 unspecified: Secondary | ICD-10-CM | POA: Insufficient documentation

## 2020-11-07 DIAGNOSIS — I4819 Other persistent atrial fibrillation: Secondary | ICD-10-CM | POA: Diagnosis not present

## 2020-11-07 DIAGNOSIS — N1832 Chronic kidney disease, stage 3b: Secondary | ICD-10-CM

## 2020-11-07 DIAGNOSIS — M1A39X0 Chronic gout due to renal impairment, multiple sites, without tophus (tophi): Secondary | ICD-10-CM

## 2020-11-07 DIAGNOSIS — I10 Essential (primary) hypertension: Secondary | ICD-10-CM | POA: Diagnosis not present

## 2020-11-07 DIAGNOSIS — E041 Nontoxic single thyroid nodule: Secondary | ICD-10-CM | POA: Diagnosis not present

## 2020-11-07 LAB — COMPREHENSIVE METABOLIC PANEL
ALT: 9 U/L (ref 0–53)
AST: 14 U/L (ref 0–37)
Albumin: 4.1 g/dL (ref 3.5–5.2)
Alkaline Phosphatase: 60 U/L (ref 39–117)
BUN: 26 mg/dL — ABNORMAL HIGH (ref 6–23)
CO2: 24 mEq/L (ref 19–32)
Calcium: 9.3 mg/dL (ref 8.4–10.5)
Chloride: 108 mEq/L (ref 96–112)
Creatinine, Ser: 2.04 mg/dL — ABNORMAL HIGH (ref 0.40–1.50)
GFR: 33.57 mL/min — ABNORMAL LOW (ref >60.00)
Glucose, Bld: 100 mg/dL — ABNORMAL HIGH (ref 70–99)
Potassium: 4 mEq/L (ref 3.5–5.1)
Sodium: 139 mEq/L (ref 135–145)
Total Bilirubin: 0.5 mg/dL (ref 0.2–1.2)
Total Protein: 7.2 g/dL (ref 6.0–8.3)

## 2020-11-07 LAB — URIC ACID: Uric Acid, Serum: 8.7 mg/dL — ABNORMAL HIGH (ref 4.0–7.8)

## 2020-11-07 LAB — TSH: TSH: 8.13 u[IU]/mL — ABNORMAL HIGH (ref 0.35–4.50)

## 2020-11-07 MED ORDER — COLCHICINE 0.6 MG PO TABS: 0.6000 mg | ORAL_TABLET | Freq: Every day | ORAL | 0 refills | Status: DC

## 2020-11-07 MED ORDER — AMLODIPINE BESYLATE 5 MG PO TABS: 5.0000 mg | ORAL_TABLET | Freq: Every day | ORAL | 3 refills | Status: DC

## 2020-11-07 MED ORDER — ALLOPURINOL 300 MG PO TABS: 300.0000 mg | ORAL_TABLET | Freq: Every day | ORAL | 6 refills | Status: DC

## 2020-11-07 NOTE — Assessment & Plan Note (Addendum)
Still in A fib on exam irreg irreg today. Start amlodipine 5 mg daily to help with rate control. We did discuss risk of stroke about 3% per year based on CHADsVASC 2 and NOAC is recommended. Since he is starting treatment for his thrombocytosis he would like to wait for a little while before this. Is taking aspirin 81 mg daily. We did also refresh his understanding of A fib. Mother did bleed to death on blood thinners from nose bleed so he has some reluctance with this option.

## 2020-11-07 NOTE — Patient Instructions (Addendum)
We will check the labs today and will start allopurinol 1 pill daily. We are also sending in colchicine to help prevent a gout flare to take 1 pill daily for the first month.  Come back in 1-2 months for labs.  We have sent in the amlodipine 5 mg daily to take for the blood pressure and the heart rate.

## 2020-11-07 NOTE — Assessment & Plan Note (Signed)
Resume amlodipine 5 mg daily to help achieve BP goal <130/80 given concurrent CKD 3b.

## 2020-11-07 NOTE — Assessment & Plan Note (Addendum)
Recent GFR 37 and we did discuss needing good BP control. No DM. Mother on dialysis prior to death not hereditary cause.

## 2020-11-07 NOTE — Assessment & Plan Note (Signed)
Checking uric acid and starting allopurinol and colchicine to get uric acid to goal. Likely associated with reduced clearance due to CKD.

## 2020-11-07 NOTE — Progress Notes (Signed)
   Subjective:   Patient ID: Howard Green, male    DOB: 24-Oct-1955, 65 y.o.   MRN: [id number]  HPI The patient is a 65 YO man coming in for follow up gout (is feeling better after prednisone pack but is still having some residual arthritis type pain in the right hand, is wanting a preventative medication for gout after several flare ups in the last year) and blood pressure (off amlodipine and lisinopril since about last August due to lack of follow up with nephrology and no remaining refills, is checking BP at home from time to time and is around what it is today, denies chest pains or headaches, complicated by CKD 3b) and a fib (taking aspirin 81 mg daily, not taking any BP medications or beta blocker currently, denies feeling symptoms of this in the past or currently).   Review of Systems  Constitutional: Negative.   HENT: Negative.   Eyes: Negative.   Respiratory: Negative for cough, chest tightness and shortness of breath.   Cardiovascular: Negative for chest pain, palpitations and leg swelling.  Gastrointestinal: Negative for abdominal distention, abdominal pain, constipation, diarrhea, nausea and vomiting.  Musculoskeletal: Positive for arthralgias.  Skin: Negative.   Neurological: Negative.   Psychiatric/Behavioral: Negative.     Objective:  Physical Exam Constitutional:      Appearance: He is well-developed and well-nourished.  HENT:     Head: Normocephalic and atraumatic.  Eyes:     Extraocular Movements: EOM normal.  Cardiovascular:     Rate and Rhythm: Normal rate. Rhythm irregular.  Pulmonary:     Effort: Pulmonary effort is normal. No respiratory distress.     Breath sounds: Normal breath sounds. No wheezing or rales.  Abdominal:     General: Bowel sounds are normal. There is no distension.     Palpations: Abdomen is soft.     Tenderness: There is no abdominal tenderness. There is no rebound.  Musculoskeletal:        General: Tenderness present. No edema.      Cervical back: Normal range of motion.  Skin:    General: Skin is warm and dry.  Neurological:     Mental Status: He is alert and oriented to person, place, and time.     Coordination: Coordination normal.  Psychiatric:        Mood and Affect: Mood and affect normal.     Vitals:   11/07/20 1350  BP: 132/68  Pulse: (!) 104  Resp: 18  Temp: 98.7 F (37.1 C)  TempSrc: Oral  SpO2: 98%  Weight: 248 lb 9.6 oz (112.8 kg)  Height: 6' (1.829 m)    This visit occurred during the SARS-CoV-2 public health emergency.  Safety protocols were in place, including screening questions prior to the visit, additional usage of staff PPE, and extensive cleaning of exam room while observing appropriate contact time as indicated for disinfecting solutions.   Assessment & Plan:

## 2020-11-07 NOTE — Assessment & Plan Note (Signed)
Checking TSH which has not been checked in some time.

## 2020-12-02 ENCOUNTER — Telehealth: Payer: Self-pay | Admitting: Hematology and Oncology

## 2020-12-02 ENCOUNTER — Other Ambulatory Visit: Payer: Self-pay

## 2020-12-02 ENCOUNTER — Inpatient Hospital Stay: Payer: Medicare HMO | Attending: Hematology and Oncology

## 2020-12-02 ENCOUNTER — Inpatient Hospital Stay: Payer: Medicare HMO | Admitting: Hematology and Oncology

## 2020-12-02 ENCOUNTER — Encounter: Payer: Self-pay | Admitting: Hematology and Oncology

## 2020-12-02 VITALS — BP 188/100 | HR 96 | Temp 98.5°F | Resp 18 | Ht 72.0 in | Wt 250.1 lb

## 2020-12-02 DIAGNOSIS — D473 Essential (hemorrhagic) thrombocythemia: Secondary | ICD-10-CM

## 2020-12-02 DIAGNOSIS — D72829 Elevated white blood cell count, unspecified: Secondary | ICD-10-CM

## 2020-12-02 DIAGNOSIS — I10 Essential (primary) hypertension: Secondary | ICD-10-CM

## 2020-12-02 DIAGNOSIS — D75839 Thrombocytosis, unspecified: Secondary | ICD-10-CM

## 2020-12-02 LAB — CBC WITH DIFFERENTIAL/PLATELET
Abs Immature Granulocytes: 0.05 10*3/uL (ref 0.00–0.07)
Basophils Absolute: 0.2 10*3/uL — ABNORMAL HIGH (ref 0.0–0.1)
Basophils Relative: 2 %
Eosinophils Absolute: 0.2 10*3/uL (ref 0.0–0.5)
Eosinophils Relative: 2 %
HCT: 42.5 % (ref 39.0–52.0)
Hemoglobin: 13.7 g/dL (ref 13.0–17.0)
Immature Granulocytes: 1 %
Lymphocytes Relative: 20 %
Lymphs Abs: 1.9 10*3/uL (ref 0.7–4.0)
MCH: 27.7 pg (ref 26.0–34.0)
MCHC: 32.2 g/dL (ref 30.0–36.0)
MCV: 85.9 fL (ref 80.0–100.0)
Monocytes Absolute: 1 10*3/uL (ref 0.1–1.0)
Monocytes Relative: 10 %
Neutro Abs: 6.3 10*3/uL (ref 1.7–7.7)
Neutrophils Relative %: 65 %
Platelets: 561 10*3/uL — ABNORMAL HIGH (ref 150–400)
RBC: 4.95 MIL/uL (ref 4.22–5.81)
RDW: 19.8 % — ABNORMAL HIGH (ref 11.5–15.5)
WBC: 9.6 10*3/uL (ref 4.0–10.5)
nRBC: 0 % (ref 0.0–0.2)

## 2020-12-02 MED ORDER — METOPROLOL SUCCINATE ER 100 MG PO TB24: 100.0000 mg | ORAL_TABLET | Freq: Every day | ORAL | 0 refills | Status: DC

## 2020-12-02 MED ORDER — HYDROXYUREA 500 MG PO CAPS: 500.0000 mg | ORAL_CAPSULE | Freq: Two times a day (BID) | ORAL | 4 refills | Status: DC

## 2020-12-02 NOTE — Progress Notes (Signed)
[name]  Patient Care Team: [name], MD as PCP - General (Internal Medicine)  CHIEF COMPLAINTS/PURPOSE OF CONSULTATION:  Thrombocytosis  ASSESSMENT & PLAN:  No problem-specific Assessment & Plan notes found for this encounter.  No orders of the defined types were placed in this encounter. #1.  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  He understands that there is increased risk of thrombosis with essential thrombocytosis and there is a small risk of transformation into acute myeloid leukemia. We have discussed about cytoreductive agents such as Hydrea since the platelet count is over 1 million and age greater than 16 and intermediate risk. He will continue aspirin 81 mg po daily Discussed about side effects of hydrea including but not limited to fatigue, leg swelling, skin rash, mouth ulcers, cytopenias and questionable carcinogenicity, He is agreeable to try it. We have also discussed about paradoxical risk of bleeding with extreme thrombocytosis and he should go to the nearest hospital with any evidence of bleeding, intractable headaches. He has been doing well with Hydrea 500 mg daily, no side effects. He has denied any other B symptoms since we saw him last visit PE unremarkable, no lymphadenopathy or hepatosplenomegaly. I have reviewed labs from today, platelet count has improved significantly. I will increase the hydrea dose to 500 mg PO BID at this time, since the target platelet count is between 100-400K  2. Uncontrolled HTN without any evidence of HTN urgency or emergency No symptoms concerning for end  organ dysfunction from HTN. I have refilled his Metoprolol for this month, encouraged to get his future refills from his PCP. HE was encouraged to go to the nearest ED with any symptoms of chest pain, chest pressure or stroke like symptoms. He expressed understanding.  PLAN  Start Hydrea 500 BID Aspirin 81 mg once a daily. Better control of HTN encouraged. RTC in 4 weeks.  HISTORY OF PRESENTING ILLNESS:   Howard Green 65 y.o. male is here because of thrombocytosis and leukocytosis.  This is a very pleasant 65 year old male patient with past medical history significant for hypertension, gout referred to hematology for evaluation of extreme thrombocytosis.  MPN work-up showed CAL reticulin mutation.    Interim history  He is here for follow-up.  He feels well, no side effects with Hydrea. He is compliant with it. No skin rash, mouth ulcers, leg swelling. No change in breathing, bowel habits or urinary habits.  No new neurological complaints. He quit taking metoprolol recently because he ran out of it. He doesn't check BP at home  Rest of the pertinent 10 point ROS reviewed and negative except as mentioned.  REVIEW OF SYSTEMS:    Constitutional: Denies fevers, chills or abnormal night sweats Eyes: Denies blurriness of vision, double vision or watery eyes Ears, nose, mouth, throat, and face: Denies mucositis or sore throat Respiratory: Denies cough, dyspnea or wheezes Cardiovascular: Denies palpitation, chest discomfort or lower extremity swelling Gastrointestinal:  Denies nausea, heartburn or change in bowel habits Skin: Denies abnormal skin rashes Lymphatics: Denies new lymphadenopathy or easy bruising Neurological:Denies numbness, tingling or new weaknesses Behavioral/Psych: Mood is stable, no new changes  All other systems were reviewed with the patient and are negative.  MEDICAL HISTORY:  Past  Medical History:  Diagnosis Date  . Allergy    seasonal like hayfever    . Bleeding of eye, bilateral   . Hypertension   . Thyroid disease    benign cyst removed 15 years ago    SURGICAL HISTORY: Past Surgical History:  Procedure Laterality Date  . THYROID SURGERY      SOCIAL HISTORY: Social History   Socioeconomic History  . Marital status: Widowed    Spouse name: Not on file  . Number of children: Not on file  . Years of education: Not on file  . Highest education level: Not on file  Occupational History  . Not on file  Tobacco Use  . Smoking status: Never Smoker  . Smokeless tobacco: Never Used  Vaping Use  . Vaping Use: Former  Substance and Sexual Activity  . Alcohol use: No    Alcohol/week: 0.0 standard drinks  . Drug use: No  . Sexual activity: Not on file  Other Topics Concern  . Not on file  Social History Narrative   Lives with wife in a one story home.  Has 4 children.     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      Social Determinants of Health   Financial Resource Strain: Not on file  Food Insecurity: Not on file  Transportation Needs: Not on file  Physical Activity: Not on file  Stress: Not on file  Social Connections: Not on file  Intimate Partner Violence: Not on file    FAMILY HISTORY: Family History  Problem Relation Age of Onset  . Diabetes Mother   . Other Mother        Good Pastures disease  . Lung cancer Father        passed 52  . Healthy Brother   . Healthy Sister   . Healthy Son   . Healthy Daughter   . Colon cancer Neg Hx   . Rectal cancer Neg Hx   . Stomach cancer Neg Hx     ALLERGIES:  has No Known Allergies.  MEDICATIONS:  Current Outpatient Medications  Medication Sig Dispense Refill  . allopurinol (ZYLOPRIM) 300 MG tablet Take 1 tablet (300 mg total) by mouth daily. 30 tablet 6  . amLODipine (NORVASC) 5 MG tablet Take 1 tablet (5 mg total) by mouth daily. 90 tablet 3  . aspirin 81 MG tablet Take 81 mg by mouth daily.    . colchicine 0.6 MG tablet Take 1 tablet (0.6 mg  total) by mouth daily. 30 tablet 0  . hydroxyurea (HYDREA) 500 MG capsule Take 1 capsule (500 mg total) by mouth 2 (two) times daily. May take with food to minimize GI side effects. 60 capsule 4  . metoprolol succinate (TOPROL XL) 100 MG 24 hr tablet Take 1 tablet (100 mg total) by mouth daily. Take with or immediately following a meal. 30 tablet 0   No current facility-administered medications for this visit.     PHYSICAL EXAMINATION: ECOG PERFORMANCE STATUS: 0 - Asymptomatic  Vitals:   12/02/20 1311 12/02/20 1338  BP: (!) 200/104 (!) 188/100  Pulse: 96   Resp: 18   Temp: 98.5 F (36.9 C)   SpO2: 100%    Filed Weights   12/02/20 1311  Weight: 250 lb 1.6 oz (113.4 kg)    GENERAL:alert, no distress and comfortable SKIN: skin color, texture, turgor are normal, no rashes or significant lesions EYES: normal, conjunctiva are pink and non-injected, sclera clear OROPHARYNX:no exudate, no  erythema and lips, buccal mucosa, and tongue normal  NECK: supple, thyroid normal size, non-tender, without nodularity LYMPH:  no palpable lymphadenopathy in the cervical, axillary or inguinal LUNGS: clear to auscultation and percussion with normal breathing effort HEART: regular rate & rhythm and no murmurs and no lower extremity edema ABDOMEN:abdomen soft, non-tender and normal bowel sounds. No hepatosplenomegaly Musculoskeletal:no cyanosis of digits and no clubbing  PSYCH: alert & oriented x 3 with fluent speech NEURO: no focal motor/sensory deficits  LABORATORY DATA:  I have reviewed the data as listed Lab Results  Component Value Date   WBC 9.6 12/02/2020   HGB 13.7 12/02/2020   HCT 42.5 12/02/2020   MCV 85.9 12/02/2020   PLT 561 (H) 12/02/2020     Chemistry      Component Value Date/Time   NA 139 11/07/2020 1435   NA 139 06/14/2018 0000   K 4.0 11/07/2020 1435   CL 108 11/07/2020 1435   CO2 24 11/07/2020 1435   BUN 26 (H) 11/07/2020 1435   BUN 21 06/14/2018 0000   CREATININE  2.04 (H) 11/07/2020 1435   GLU 98 06/14/2018 0000      Component Value Date/Time   CALCIUM 9.3 11/07/2020 1435   ALKPHOS 60 11/07/2020 1435   AST 14 11/07/2020 1435   ALT 9 11/07/2020 1435   BILITOT 0.5 11/07/2020 1435     Labs from 5 yrs ago show persistent leukocytosis, thrombocytosis which has progressed in the past 5 yrs. MPN work-up showed CAL reticulin mutation. Reviewed CBC from today, platelets improved significantly.  RADIOGRAPHIC STUDIES: I have personally reviewed the radiological images as listed and agreed with the findings in the report.  No results found.  All questions were answered. The patient knows to call the clinic with any problems, questions or concerns. I spent 30 minutes in the care of this patient including history and physical, review of records, counseling and coordination of care.    Benay Pike, MD 12/02/2020 7:22 PM

## 2020-12-02 NOTE — Telephone Encounter (Signed)
Scheduled appointment per 03/01 los. Patient is aware and received a calender.

## 2020-12-05 ENCOUNTER — Other Ambulatory Visit: Payer: Self-pay | Admitting: Internal Medicine

## 2020-12-10 LAB — BCR/ABL

## 2020-12-23 ENCOUNTER — Ambulatory Visit: Payer: Medicare HMO | Admitting: Internal Medicine

## 2020-12-26 ENCOUNTER — Encounter: Payer: Self-pay | Admitting: Internal Medicine

## 2020-12-26 ENCOUNTER — Ambulatory Visit (INDEPENDENT_AMBULATORY_CARE_PROVIDER_SITE_OTHER): Payer: Medicare HMO | Admitting: Internal Medicine

## 2020-12-26 ENCOUNTER — Other Ambulatory Visit: Payer: Self-pay

## 2020-12-26 VITALS — BP 136/80 | HR 109 | Temp 99.1°F | Resp 18 | Ht 72.0 in | Wt 247.6 lb

## 2020-12-26 DIAGNOSIS — I10 Essential (primary) hypertension: Secondary | ICD-10-CM | POA: Diagnosis not present

## 2020-12-26 DIAGNOSIS — M10271 Drug-induced gout, right ankle and foot: Secondary | ICD-10-CM

## 2020-12-26 LAB — RENAL FUNCTION PANEL
Albumin: 4.3 g/dL (ref 3.5–5.2)
BUN: 23 mg/dL (ref 6–23)
CO2: 23 mEq/L (ref 19–32)
Calcium: 9 mg/dL (ref 8.4–10.5)
Chloride: 109 mEq/L (ref 96–112)
Creatinine, Ser: 1.72 mg/dL — ABNORMAL HIGH (ref 0.40–1.50)
GFR: 41.15 mL/min — ABNORMAL LOW (ref >60.00)
Glucose, Bld: 111 mg/dL — ABNORMAL HIGH (ref 70–99)
Phosphorus: 3.3 mg/dL (ref 2.3–4.6)
Potassium: 4.1 mEq/L (ref 3.5–5.1)
Sodium: 142 mEq/L (ref 135–145)

## 2020-12-26 LAB — URIC ACID: Uric Acid, Serum: 5.2 mg/dL (ref 4.0–7.8)

## 2020-12-26 MED ORDER — METOPROLOL SUCCINATE ER 100 MG PO TB24: 100.0000 mg | ORAL_TABLET | Freq: Every day | ORAL | 3 refills | Status: DC

## 2020-12-26 NOTE — Patient Instructions (Signed)
We will check the labs today and adjust the medicine if needed.

## 2020-12-26 NOTE — Assessment & Plan Note (Signed)
Started back on metoprolol 100 mg daily and is on amlodipine 5 mg daily as well. Close to goal so will refill metoprolol and recheck at next visit.

## 2020-12-26 NOTE — Progress Notes (Signed)
   Subjective:   Patient ID: Howard Green, male    DOB: May 20, 1956, 65 y.o.   MRN: [id number]  HPI The patient is a 65 YO man coming in for follow up gout. Started allopurinol and colchicine after last visit and did well. Took colchicine for the first month and now is on allopurinol alone. He has not had gout flare with that. Does have some aches and pains but not to the level of gout.  Review of Systems  Constitutional: Negative.   HENT: Negative.   Eyes: Negative.   Respiratory: Negative for cough, chest tightness and shortness of breath.   Cardiovascular: Negative for chest pain, palpitations and leg swelling.  Gastrointestinal: Negative for abdominal distention, abdominal pain, constipation, diarrhea, nausea and vomiting.  Musculoskeletal: Positive for arthralgias.  Skin: Negative.   Neurological: Negative.   Psychiatric/Behavioral: Negative.     Objective:  Physical Exam Constitutional:      Appearance: He is well-developed.  HENT:     Head: Normocephalic and atraumatic.  Cardiovascular:     Rate and Rhythm: Normal rate. Rhythm irregular.  Pulmonary:     Effort: Pulmonary effort is normal. No respiratory distress.     Breath sounds: Normal breath sounds. No wheezing or rales.  Abdominal:     General: Bowel sounds are normal. There is no distension.     Palpations: Abdomen is soft.     Tenderness: There is no abdominal tenderness. There is no rebound.  Musculoskeletal:     Cervical back: Normal range of motion.  Skin:    General: Skin is warm and dry.  Neurological:     Mental Status: He is alert and oriented to person, place, and time.     Coordination: Coordination normal.     Vitals:   12/26/20 1425  BP: 136/80  Pulse: (!) 109  Resp: 18  Temp: 99.1 F (37.3 C)  TempSrc: Oral  SpO2: 98%  Weight: 247 lb 9.6 oz (112.3 kg)  Height: 6' (1.829 m)    This visit occurred during the SARS-CoV-2 public health emergency.  Safety protocols were in place,  including screening questions prior to the visit, additional usage of staff PPE, and extensive cleaning of exam room while observing appropriate contact time as indicated for disinfecting solutions.   Assessment & Plan:

## 2020-12-26 NOTE — Assessment & Plan Note (Signed)
Checking uric acid and renal function and adjust allopurinol 300 mg daily as needed.

## 2021-01-05 ENCOUNTER — Telehealth: Payer: Self-pay | Admitting: Hematology and Oncology

## 2021-01-05 NOTE — Telephone Encounter (Signed)
Rescheduled per 4/4 sch msg. Called and spoke with pt, confirmed 4/14 appt

## 2021-01-06 ENCOUNTER — Inpatient Hospital Stay: Payer: Medicare HMO | Admitting: Hematology and Oncology

## 2021-01-06 ENCOUNTER — Inpatient Hospital Stay: Payer: Medicare HMO

## 2021-01-15 ENCOUNTER — Encounter: Payer: Self-pay | Admitting: Hematology and Oncology

## 2021-01-15 ENCOUNTER — Other Ambulatory Visit: Payer: Self-pay

## 2021-01-15 ENCOUNTER — Inpatient Hospital Stay: Payer: Medicare HMO | Attending: Hematology and Oncology

## 2021-01-15 ENCOUNTER — Inpatient Hospital Stay: Payer: Medicare HMO | Admitting: Hematology and Oncology

## 2021-01-15 VITALS — BP 151/75 | HR 88 | Temp 98.1°F | Resp 18 | Ht 72.0 in | Wt 250.7 lb

## 2021-01-15 DIAGNOSIS — D72829 Elevated white blood cell count, unspecified: Secondary | ICD-10-CM | POA: Insufficient documentation

## 2021-01-15 DIAGNOSIS — D473 Essential (hemorrhagic) thrombocythemia: Secondary | ICD-10-CM | POA: Diagnosis not present

## 2021-01-15 DIAGNOSIS — I10 Essential (primary) hypertension: Secondary | ICD-10-CM | POA: Insufficient documentation

## 2021-01-15 DIAGNOSIS — D75839 Thrombocytosis, unspecified: Secondary | ICD-10-CM | POA: Insufficient documentation

## 2021-01-15 LAB — CBC WITH DIFFERENTIAL/PLATELET
Abs Immature Granulocytes: 0.06 10*3/uL (ref 0.00–0.07)
Basophils Absolute: 0.1 10*3/uL (ref 0.0–0.1)
Basophils Relative: 1 %
Eosinophils Absolute: 0.1 10*3/uL (ref 0.0–0.5)
Eosinophils Relative: 1 %
HCT: 42.5 % (ref 39.0–52.0)
Hemoglobin: 14.1 g/dL (ref 13.0–17.0)
Immature Granulocytes: 1 %
Lymphocytes Relative: 20 %
Lymphs Abs: 2.4 10*3/uL (ref 0.7–4.0)
MCH: 29.8 pg (ref 26.0–34.0)
MCHC: 33.2 g/dL (ref 30.0–36.0)
MCV: 89.9 fL (ref 80.0–100.0)
Monocytes Absolute: 1 10*3/uL (ref 0.1–1.0)
Monocytes Relative: 8 %
Neutro Abs: 8.5 10*3/uL — ABNORMAL HIGH (ref 1.7–7.7)
Neutrophils Relative %: 69 %
Platelets: 733 10*3/uL — ABNORMAL HIGH (ref 150–400)
RBC: 4.73 MIL/uL (ref 4.22–5.81)
RDW: 18.5 % — ABNORMAL HIGH (ref 11.5–15.5)
WBC: 12.2 10*3/uL — ABNORMAL HIGH (ref 4.0–10.5)
nRBC: 0 % (ref 0.0–0.2)

## 2021-01-15 NOTE — Progress Notes (Signed)
[facility] NOTE  Patient Care Team: [name], MD as PCP - General (Internal Medicine)  CHIEF COMPLAINTS/PURPOSE OF CONSULTATION:  Thrombocytosis  ASSESSMENT & PLAN:  No problem-specific Assessment & Plan notes found for this encounter.  No orders of the defined types were placed in this encounter. #1.  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   He was started on aspirin and Hydrea given age above 26. He has not been very compliant with Hydrea, apparently has been taking once daily for most of the days given his moving another social circumstances.  He feels well otherwise. Physical examination today unremarkable, no palpable lymphadenopathy or hepatosplenomegaly.  We reviewed his CBC today which shows leukocytosis and thrombocytosis slightly worsened compared to last labs.  I recommended that he continue hydroxyurea twice daily as well as aspirin so we can titrate the dose to maintain a platelet count between 100-400,000.  He understands the high risk of cardiovascular events with thrombocytosis from CAL reticulin mutation.  2. Uncontrolled HTN without any evidence of HTN urgency or emergency No symptoms concerning for end organ dysfunction from HTN. He is currently taking metoprolol.  He should follow-up with his PCP for management of hypertension in the future.  PLAN  Continue Hydrea 500 BID Aspirin 81 mg once a daily. Better control of HTN encouraged. RTC in 4 weeks.  HISTORY OF PRESENTING ILLNESS:   Howard Green 65 y.o. male is here because of thrombocytosis and leukocytosis.  This is a very pleasant  65 year old male patient with past medical history significant for hypertension, gout referred to hematology for evaluation of extreme thrombocytosis.  MPN work-up showed CAL reticulin mutation.    Interim history  He is here for follow-up.   Denies any adverse effects from Hydrea.  No B symptoms.  He has been moving and hence was busy and has not been taking Hydrea diligently twice a day.  Most of the days he only takes single dose.  He is taking his metoprolol for management of his hypertension.  No change in breathing, bowel habits or urinary habits.  No skin rash, mouth ulcers.  Rest of the pertinent 10 point ROS reviewed and negative except as mentioned.  REVIEW OF SYSTEMS:    Constitutional: Denies fevers, chills or abnormal night sweats Eyes: Denies blurriness of vision, double vision or watery eyes Ears, nose, mouth, throat, and face: Denies mucositis or sore throat Respiratory: Denies cough, dyspnea or wheezes Cardiovascular: Denies palpitation, chest discomfort or lower extremity swelling Gastrointestinal:  Denies nausea, heartburn or change in bowel habits Skin: Denies abnormal skin rashes Lymphatics: Denies new lymphadenopathy or easy bruising Neurological:Denies numbness, tingling or new weaknesses Behavioral/Psych: Mood is stable, no new changes  All other systems were reviewed with the patient and are negative.  MEDICAL HISTORY:  Past Medical History:  Diagnosis Date  . Allergy    seasonal like hayfever   . Bleeding of eye, bilateral   . Hypertension   . Thyroid disease    benign cyst removed 15 years ago    SURGICAL HISTORY: Past Surgical History:  Procedure Laterality Date  . THYROID SURGERY      SOCIAL HISTORY: Social History   Socioeconomic History  . Marital status: Widowed  Spouse name: Not on file  . Number of children: Not on file  . Years of education: Not on file  . Highest education level: Not on file  Occupational History  . Not on file   Tobacco Use  . Smoking status: Never Smoker  . Smokeless tobacco: Never Used  Vaping Use  . Vaping Use: Former  Substance and Sexual Activity  . Alcohol use: No    Alcohol/week: 0.0 standard drinks  . Drug use: No  . Sexual activity: Not on file  Other Topics Concern  . Not on file  Social History Narrative   Lives with wife in a one story home.  Has 4 children.     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      Social Determinants of Health   Financial Resource Strain: Not on file  Food Insecurity: Not on file  Transportation Needs: Not on file  Physical Activity: Not on file  Stress: Not on file  Social Connections: Not on file  Intimate Partner Violence: Not on file    FAMILY HISTORY: Family History  Problem Relation Age of Onset  . Diabetes Mother   . Other Mother        Good Pastures disease  . Lung cancer Father        passed 75  . Healthy Brother   . Healthy Sister   . Healthy Son   . Healthy Daughter   . Colon cancer Neg Hx   . Rectal cancer Neg Hx   . Stomach cancer Neg Hx     ALLERGIES:  has No Known Allergies.  MEDICATIONS:  Current Outpatient Medications  Medication Sig Dispense Refill  . allopurinol (ZYLOPRIM) 300 MG tablet Take 1 tablet (300 mg total) by mouth daily. 30 tablet 6  . amLODipine (NORVASC) 5 MG tablet Take 1 tablet (5 mg total) by mouth daily. 90 tablet 3  . aspirin 81 MG tablet Take 81 mg by mouth daily.    . hydroxyurea (HYDREA) 500 MG capsule Take 1 capsule (500 mg total) by mouth 2 (two) times daily. May take with food to minimize GI side effects. 60 capsule 4  . metoprolol succinate (TOPROL XL) 100 MG 24 hr tablet Take 1 tablet (100 mg total) by mouth daily. Take with or immediately following a meal. 90 tablet 3   No current facility-administered medications for this visit.     PHYSICAL EXAMINATION: ECOG PERFORMANCE STATUS: 0 - Asymptomatic  Vitals:   01/15/21 1243  BP: (!) 151/75  Pulse: 88  Resp: 18  Temp:  98.1 F (36.7 C)  SpO2: 100%   Filed Weights   01/15/21 1243  Weight: 250 lb 11.2 oz (113.7 kg)    GENERAL:alert, no distress and comfortable SKIN: skin color, texture, turgor are normal, no rashes or significant lesions EYES: normal, conjunctiva are pink and non-injected, sclera clear OROPHARYNX:no exudate, no erythema and lips, buccal mucosa, and tongue normal  NECK: supple, thyroid normal size, non-tender, without nodularity LYMPH:  no palpable lymphadenopathy in the cervical, axillary or inguinal LUNGS: clear to auscultation and percussion with normal breathing effort HEART: regular rate & rhythm and no murmurs and no lower extremity edema ABDOMEN:abdomen soft, non-tender and normal bowel sounds. No hepatosplenomegaly Musculoskeletal:no cyanosis of digits and no clubbing  PSYCH: alert & oriented x 3 with fluent speech NEURO: no focal motor/sensory deficits  LABORATORY DATA:  I have reviewed the data as listed Lab Results  Component Value Date   WBC  12.2 (H) 01/15/2021   HGB 14.1 01/15/2021   HCT 42.5 01/15/2021   MCV 89.9 01/15/2021   PLT 733 (H) 01/15/2021     Chemistry      Component Value Date/Time   NA 142 12/26/2020 1444   NA 139 06/14/2018 0000   K 4.1 12/26/2020 1444   CL 109 12/26/2020 1444   CO2 23 12/26/2020 1444   BUN 23 12/26/2020 1444   BUN 21 06/14/2018 0000   CREATININE 1.72 (H) 12/26/2020 1444   GLU 98 06/14/2018 0000      Component Value Date/Time   CALCIUM 9.0 12/26/2020 1444   ALKPHOS 60 11/07/2020 1435   AST 14 11/07/2020 1435   ALT 9 11/07/2020 1435   BILITOT 0.5 11/07/2020 1435     Labs from 5 yrs ago show persistent leukocytosis, thrombocytosis which has progressed in the past 5 yrs. MPN work-up showed CAL reticulin mutation. Reviewed CBC from today, leukocytosis and thrombocytosis worsened compared to last visit likely because of noncompliance.  RADIOGRAPHIC STUDIES: I have personally reviewed the radiological images as listed and  agreed with the findings in the report.  No results found.  All questions were answered. The patient knows to call the clinic with any problems, questions or concerns. I spent 20 minutes in the care of this patient including history and physical, review of records, counseling and coordination of care.    Benay Pike, MD 01/15/2021 12:57 PM

## 2021-02-08 IMAGING — CT CT HEAD CODE STROKE
3 series · 14 of 47 positions shown, 16 images · non-contrast
Comparison: 01/19/2015

CLINICAL DATA: Code stroke. Acute vertigo. New onset atrial
fibrillation. History of hypertension.

EXAM:
CT HEAD WITHOUT CONTRAST
TECHNIQUE: Contiguous axial images were obtained from the base of the skull
through the vertex without intravenous contrast.

[Series 2: head wo · axial · 0.49mm/px · z∈[-177,-37]mm · 8 of 34 slices shown, 10 images]
[im 3/34  brain]
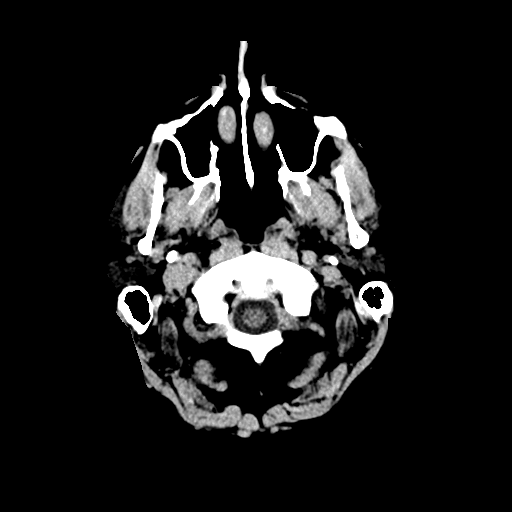
[im 3/34  bone]
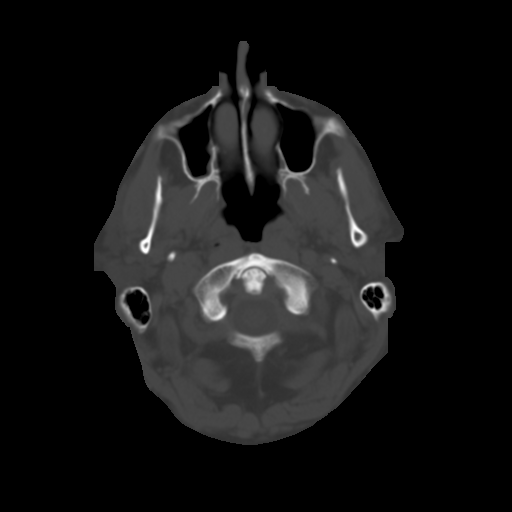
[im 7/34  brain]
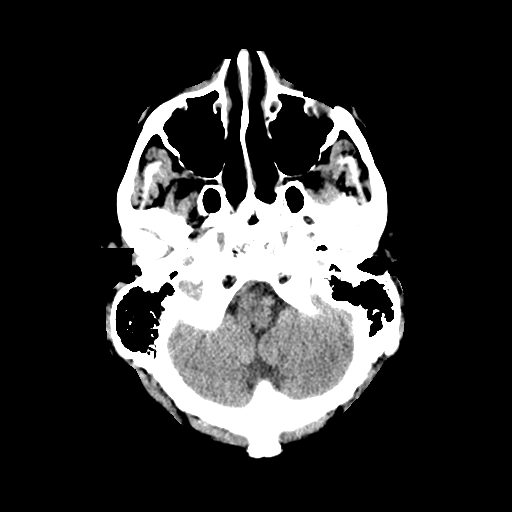
[im 11/34  brain]
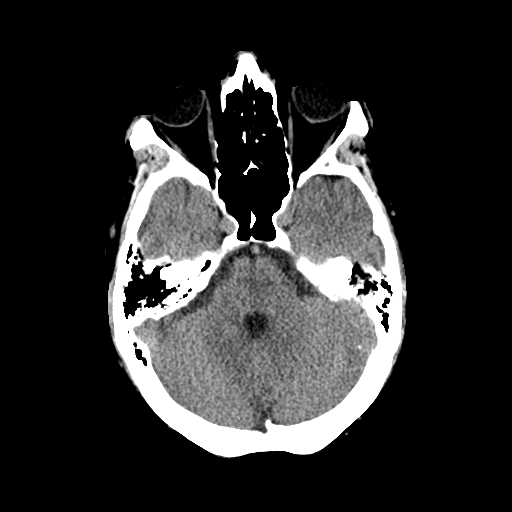
[im 15/34  brain]
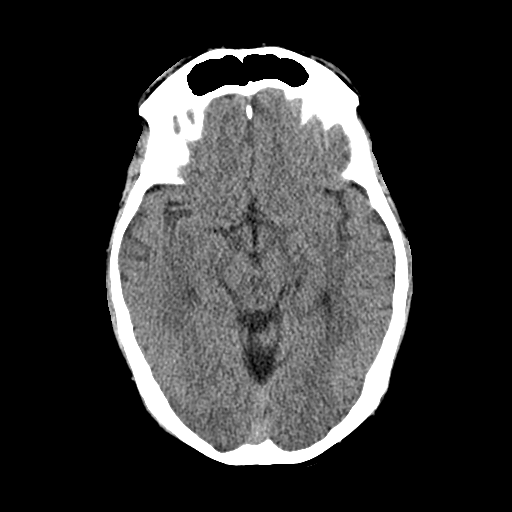
[im 19/34  brain]
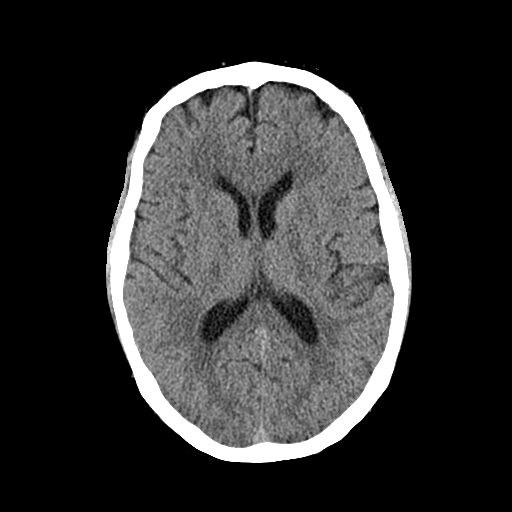
[im 19/34  bone]
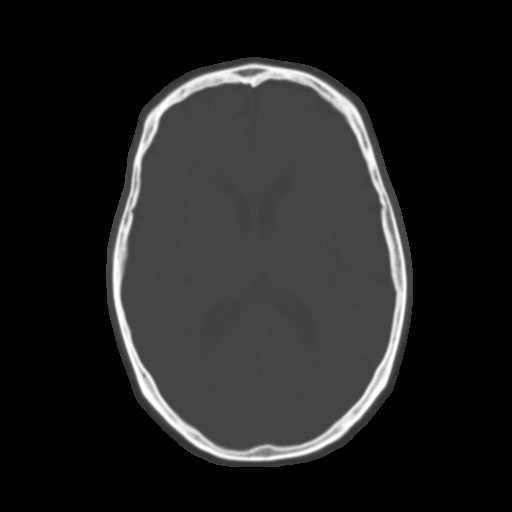
[im 23/34  brain]
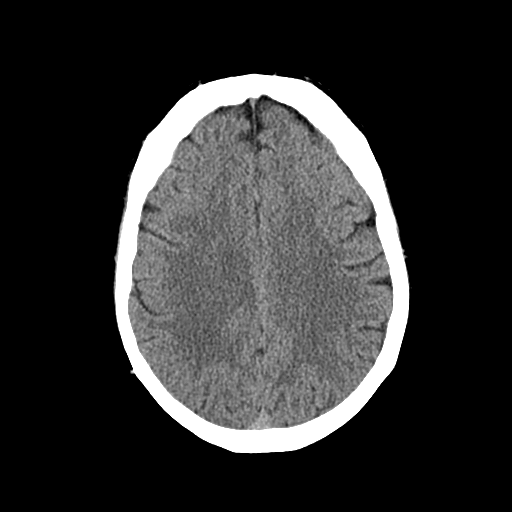
[im 27/34  brain]
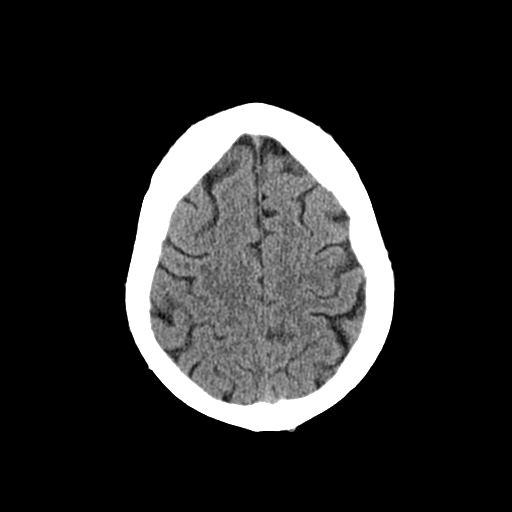
[im 31/34  brain]
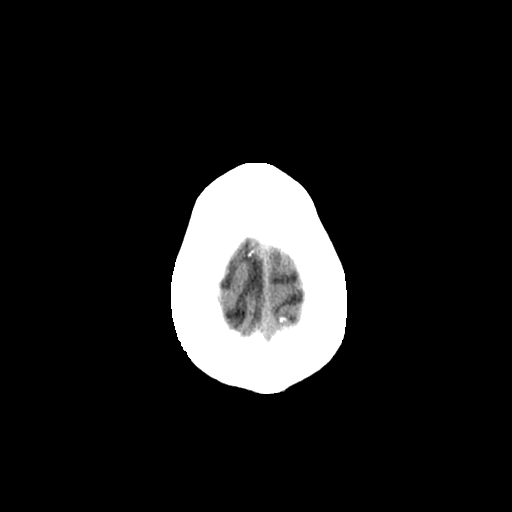

[Series 4: cor soft · coronal · 0.33mm/px · 3 of 84 slices shown]
[im 28/84  brain]
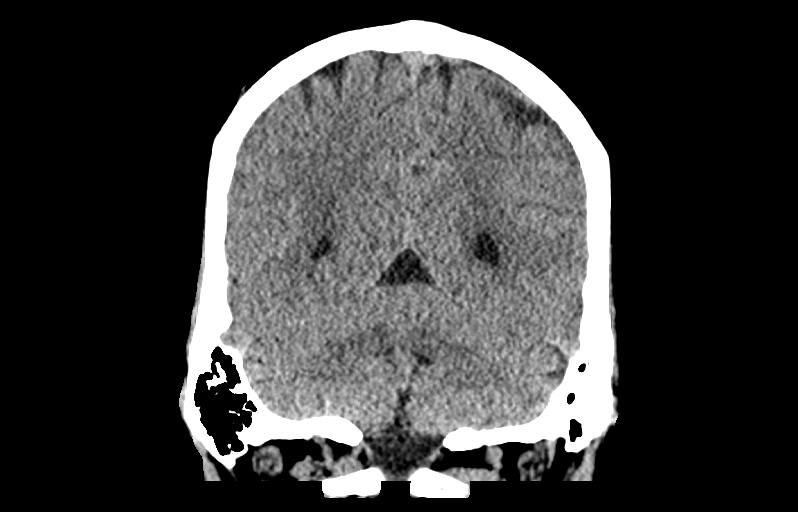
[im 37/84  brain]
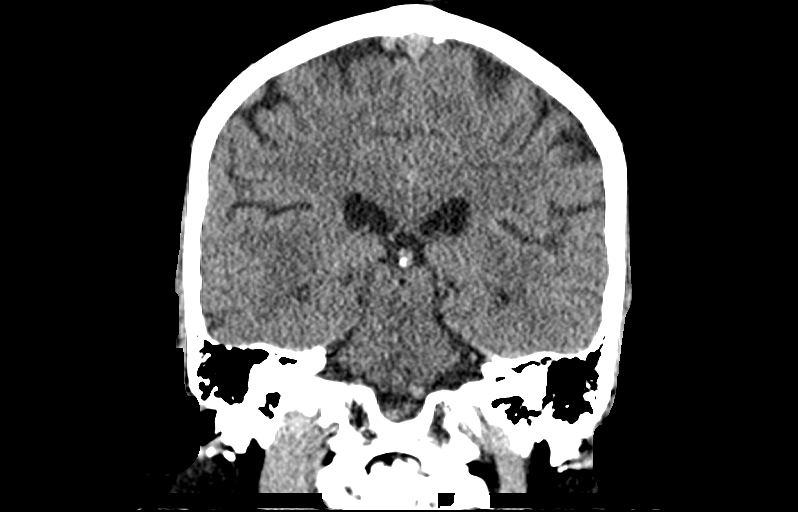
[im 47/84  brain]
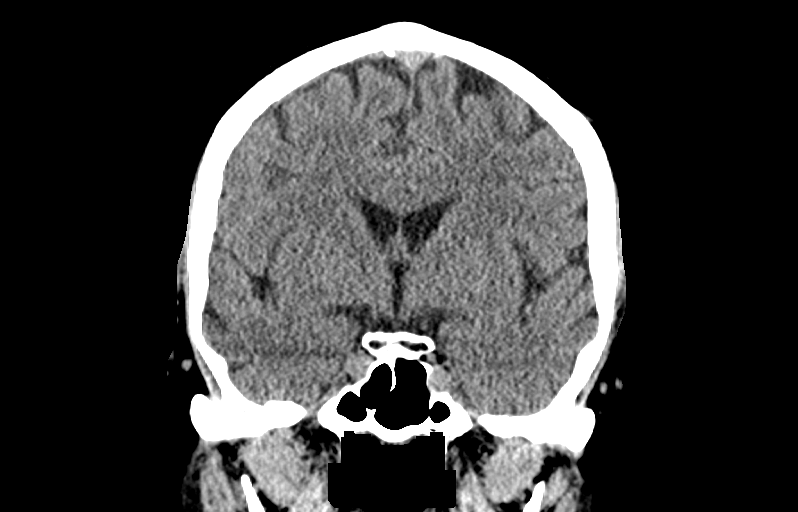

[Series 5: sag soft · sagittal · 0.33mm/px · 3 of 88 slices shown]
[im 30/88  brain]
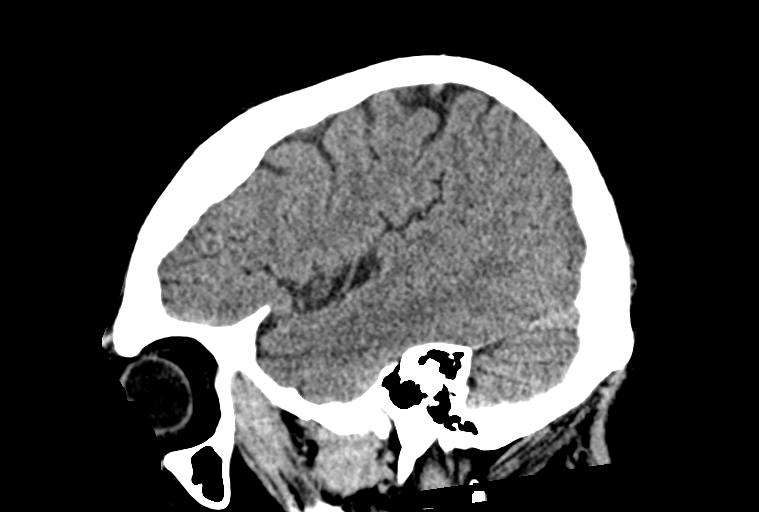
[im 44/88  brain]
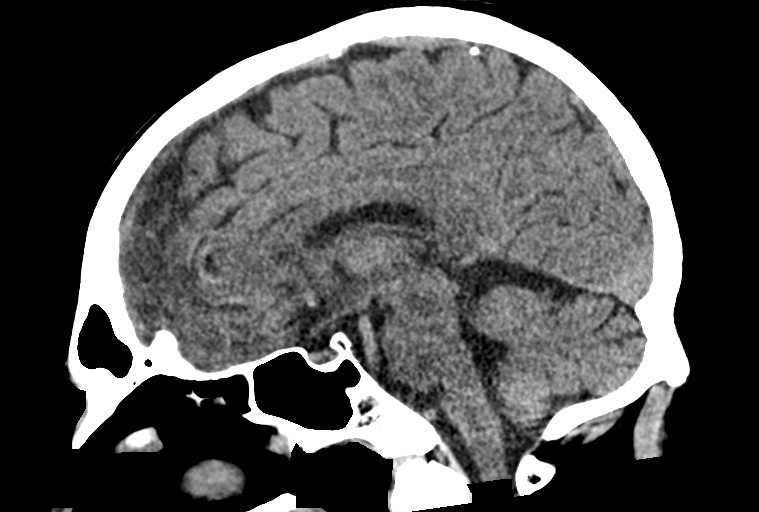
[im 59/88  brain]
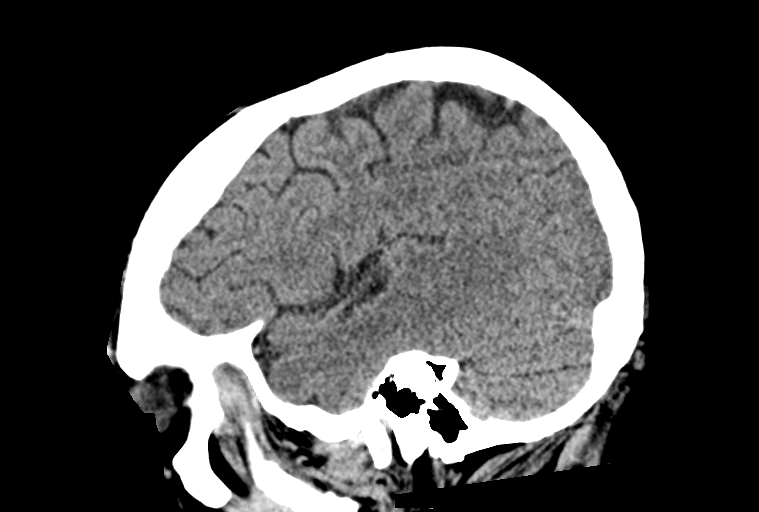

[14 of 47 positions shown; findings below may reference images not displayed]

FINDINGS: Brain: There is no evidence of acute infarct, intracranial
hemorrhage, mass, midline shift, or extra-axial fluid collection.
The ventricles and sulci are normal.

Vascular: Mild calcified atherosclerosis at the skull base. No
hyperdense vessel.

Skull: No fracture or focal osseous lesion.

Sinuses/Orbits: Mild right maxillary sinus mucosal thickening. Clear
mastoid air cells. Unremarkable orbits.

Other: None.

ASPECTS ([place] Stroke Program Early CT Score)

Not scored with this symptomatology.
IMPRESSION: Unremarkable CT appearance of the brain.

These results were called by telephone at the time of interpretation
on 09/02/2019 at [DATE] to provider Navarrete Ayuso , who verbally
acknowledged these results.

## 2021-02-12 ENCOUNTER — Inpatient Hospital Stay: Payer: Medicare HMO | Attending: Hematology and Oncology | Admitting: Hematology and Oncology

## 2021-02-12 ENCOUNTER — Inpatient Hospital Stay: Payer: Medicare HMO

## 2021-02-12 ENCOUNTER — Encounter: Payer: Self-pay | Admitting: Hematology and Oncology

## 2021-02-12 ENCOUNTER — Other Ambulatory Visit: Payer: Self-pay

## 2021-02-12 VITALS — BP 167/88 | HR 96 | Temp 96.9°F | Resp 17 | Wt 253.5 lb

## 2021-02-12 DIAGNOSIS — Z79899 Other long term (current) drug therapy: Secondary | ICD-10-CM | POA: Diagnosis not present

## 2021-02-12 DIAGNOSIS — D75839 Thrombocytosis, unspecified: Secondary | ICD-10-CM | POA: Insufficient documentation

## 2021-02-12 DIAGNOSIS — D473 Essential (hemorrhagic) thrombocythemia: Secondary | ICD-10-CM | POA: Diagnosis not present

## 2021-02-12 DIAGNOSIS — Z7982 Long term (current) use of aspirin: Secondary | ICD-10-CM | POA: Diagnosis not present

## 2021-02-12 LAB — CMP (CANCER CENTER ONLY)
ALT: 9 U/L (ref 0–44)
AST: 15 U/L (ref 15–41)
Albumin: 3.9 g/dL (ref 3.5–5.0)
Alkaline Phosphatase: 64 U/L (ref 38–126)
Anion gap: 9 (ref 5–15)
BUN: 22 mg/dL (ref 8–23)
CO2: 23 mmol/L (ref 22–32)
Calcium: 9.1 mg/dL (ref 8.9–10.3)
Chloride: 111 mmol/L (ref 98–111)
Creatinine: 1.99 mg/dL — ABNORMAL HIGH (ref 0.61–1.24)
GFR, Estimated: 37 mL/min — ABNORMAL LOW (ref >60)
Glucose, Bld: 103 mg/dL — ABNORMAL HIGH (ref 70–99)
Potassium: 4 mmol/L (ref 3.5–5.1)
Sodium: 143 mmol/L (ref 135–145)
Total Bilirubin: 0.9 mg/dL (ref 0.3–1.2)
Total Protein: 7 g/dL (ref 6.5–8.1)

## 2021-02-12 LAB — CBC WITH DIFFERENTIAL/PLATELET
Abs Immature Granulocytes: 0.04 10*3/uL (ref 0.00–0.07)
Basophils Absolute: 0.1 10*3/uL (ref 0.0–0.1)
Basophils Relative: 1 %
Eosinophils Absolute: 0.1 10*3/uL (ref 0.0–0.5)
Eosinophils Relative: 1 %
HCT: 43.1 % (ref 39.0–52.0)
Hemoglobin: 14.5 g/dL (ref 13.0–17.0)
Immature Granulocytes: 0 %
Lymphocytes Relative: 17 %
Lymphs Abs: 2.2 10*3/uL (ref 0.7–4.0)
MCH: 31.1 pg (ref 26.0–34.0)
MCHC: 33.6 g/dL (ref 30.0–36.0)
MCV: 92.5 fL (ref 80.0–100.0)
Monocytes Absolute: 1 10*3/uL (ref 0.1–1.0)
Monocytes Relative: 8 %
Neutro Abs: 9.3 10*3/uL — ABNORMAL HIGH (ref 1.7–7.7)
Neutrophils Relative %: 73 %
Platelets: 522 10*3/uL — ABNORMAL HIGH (ref 150–400)
RBC: 4.66 MIL/uL (ref 4.22–5.81)
RDW: 17.2 % — ABNORMAL HIGH (ref 11.5–15.5)
WBC: 12.7 10*3/uL — ABNORMAL HIGH (ref 4.0–10.5)
nRBC: 0 % (ref 0.0–0.2)

## 2021-02-12 NOTE — Progress Notes (Signed)
[facility] FOLLOW UP NOTE  Patient Care Team: [name], MD as PCP - General (Internal Medicine)  CHIEF COMPLAINTS/PURPOSE OF CONSULTATION:  ET follow up after hydrea dose modificatio  ASSESSMENT & PLAN:  No problem-specific Assessment & Plan notes found for this encounter.  Orders Placed This Encounter  Procedures  . CBC with Differential/Platelet    Standing Status:   Standing    Number of Occurrences:   22    Standing Expiration Date:   02/12/2022  . 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 Review of systems without any concerning adverse effects. Physical examination unremarkable, no palpable lymphadenopathy or hepatosplenomegaly. No toxicity from Hydrea.  I recommended that he continue Hydrea twice a day, aspirin daily and proceed with labs today to see if any further titration of Hydrea dose is needed to maintain platelet count within the therapeutic range of 100,000-400,000. He is agreeable to these recommendations, will return to clinic in 8 weeks with repeat labs.  HISTORY OF PRESENTING ILLNESS:  Howard Green 65 y.o. male is here because of ET   Oncology History   No history exists.   INTERIM HISTORY  Patient is here for a FU on ET. He is taking hydrea BID 90% of the times. No fatigue, mouth ulcers, lower extremity swelling. He is taking aspirin daily No change in breathing No more flares up of gout since he has been taking allopurinol.  REVIEW OF SYSTEMS:   Constitutional: Denies fevers, chills or abnormal night sweats Eyes: Denies blurriness of vision, double vision or watery  eyes Ears, nose, mouth, throat, and face: Denies mucositis or sore throat Respiratory: Denies cough, dyspnea or wheezes Cardiovascular: Denies palpitation, chest discomfort or lower extremity swelling Gastrointestinal:  Denies nausea, heartburn or change in bowel habits Skin: Denies abnormal skin rashes Lymphatics: Denies new lymphadenopathy or easy bruising Neurological:Denies numbness, tingling or new weaknesses Behavioral/Psych: Mood is stable, no new changes  All other systems were reviewed with the patient and are negative.   MEDICAL HISTORY:  Past Medical History:  Diagnosis Date  . Allergy    seasonal like hayfever   . Bleeding of eye, bilateral   . Hypertension   . Thyroid disease    benign cyst removed 15 years ago    SURGICAL HISTORY: Past Surgical History:  Procedure Laterality Date  . THYROID SURGERY      SOCIAL HISTORY: Social History   Socioeconomic History  . Marital status: Widowed    Spouse name: Not on file  . Number of children: Not on file  . Years of education: Not on file  . Highest education level: Not on file  Occupational History  . Not on file  Tobacco Use  . Smoking status: Never Smoker  . Smokeless tobacco: Never Used  Vaping Use  . Vaping Use: Former  Substance and Sexual Activity  . Alcohol use: No    Alcohol/week: 0.0 standard drinks  . Drug use: No  . Sexual activity: Not on file  Other Topics Concern  . Not on file  Social History Narrative   Lives with wife in a one story home.  Has 4 children.  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      Social Determinants of Health   Financial Resource Strain: Not on file  Food Insecurity: Not on file  Transportation Needs: Not on file  Physical Activity: Not on file  Stress: Not on file  Social Connections: Not on file  Intimate Partner Violence: Not on file    FAMILY HISTORY: Family History  Problem Relation Age of Onset  . Diabetes Mother   . Other Mother         Good Pastures disease  . Lung cancer Father        passed 92  . Healthy Brother   . Healthy Sister   . Healthy Son   . Healthy Daughter   . Colon cancer Neg Hx   . Rectal cancer Neg Hx   . Stomach cancer Neg Hx     ALLERGIES:  has No Known Allergies.  MEDICATIONS:  Current Outpatient Medications  Medication Sig Dispense Refill  . allopurinol (ZYLOPRIM) 300 MG tablet Take 1 tablet (300 mg total) by mouth daily. 30 tablet 6  . amLODipine (NORVASC) 5 MG tablet Take 1 tablet (5 mg total) by mouth daily. 90 tablet 3  . aspirin 81 MG tablet Take 81 mg by mouth daily.    . hydroxyurea (HYDREA) 500 MG capsule Take 1 capsule (500 mg total) by mouth 2 (two) times daily. May take with food to minimize GI side effects. 60 capsule 4  . metoprolol succinate (TOPROL XL) 100 MG 24 hr tablet Take 1 tablet (100 mg total) by mouth daily. Take with or immediately following a meal. 90 tablet 3   No current facility-administered medications for this visit.      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 EYES: normal, conjunctiva are pink and non-injected, sclera clear OROPHARYNX:no exudate, no erythema and lips, buccal mucosa, and tongue normal  NECK: supple, thyroid normal size, non-tender, without nodularity LYMPH:  no palpable lymphadenopathy in the cervical, axillary or inguinal LUNGS: clear to auscultation and percussion with normal breathing effort HEART: regular rate & rhythm and no murmurs and no lower extremity edema ABDOMEN:abdomen soft, non-tender and normal bowel sounds Musculoskeletal:no cyanosis of digits and no clubbing  PSYCH: alert &  oriented x 3 with fluent speech NEURO: no focal motor/sensory deficits  LABORATORY DATA:  I have reviewed the data as listed Lab Results  Component Value Date   WBC 12.2 (H) 01/15/2021   HGB 14.1 01/15/2021   HCT 42.5 01/15/2021   MCV 89.9 01/15/2021   PLT 733 (H) 01/15/2021     Chemistry      Component Value Date/Time   NA 142 12/26/2020 1444   NA 139 06/14/2018 0000   K 4.1 12/26/2020 1444   CL 109 12/26/2020 1444   CO2 23 12/26/2020 1444   BUN 23 12/26/2020 1444   BUN 21 06/14/2018 0000   CREATININE 1.72 (H) 12/26/2020 1444   GLU 98 06/14/2018 0000      Component Value Date/Time   CALCIUM 9.0 12/26/2020 1444   ALKPHOS 60 11/07/2020 1435   AST  14 11/07/2020 1435   ALT 9 11/07/2020 1435   BILITOT 0.5 11/07/2020 1435     CBC from today reviewed white blood cell count is 12,700, platelet count is 522,000 consistent with response from Hydrea.  I will recommend that he continue to take it twice daily 100% of the days if possible.  RADIOGRAPHIC STUDIES: I have personally reviewed the radiological images as listed and agreed with the findings in the report.  All questions were answered. The patient knows to call the clinic with any problems, questions or concerns.    Benay Pike, MD 02/12/2021 1:07 PM

## 2021-02-13 ENCOUNTER — Telehealth: Payer: Self-pay

## 2021-02-13 NOTE — Telephone Encounter (Signed)
This nurse called patient and verified that he is taking Hydrea 500mg  BID.  Patient acknowledged he is taking medication as prescribed.  Informed that his platelet level remains elevated and Dr. Chryl Heck would like to see him for labs and office visit in 4 weeks.  No further questions or concerns at this time.

## 2021-02-16 ENCOUNTER — Telehealth: Payer: Self-pay | Admitting: Hematology and Oncology

## 2021-02-16 NOTE — Telephone Encounter (Signed)
Scheduled appt per 5/13 sch msg. Pt aware.  

## 2021-03-12 ENCOUNTER — Inpatient Hospital Stay (HOSPITAL_BASED_OUTPATIENT_CLINIC_OR_DEPARTMENT_OTHER): Payer: Medicare HMO | Admitting: Hematology and Oncology

## 2021-03-12 ENCOUNTER — Other Ambulatory Visit: Payer: Self-pay

## 2021-03-12 ENCOUNTER — Inpatient Hospital Stay: Payer: Medicare HMO | Attending: Hematology and Oncology

## 2021-03-12 ENCOUNTER — Encounter: Payer: Self-pay | Admitting: Hematology and Oncology

## 2021-03-12 ENCOUNTER — Telehealth: Payer: Self-pay | Admitting: Hematology and Oncology

## 2021-03-12 VITALS — BP 160/102 | HR 58 | Temp 98.8°F | Resp 19 | Ht 72.0 in | Wt 255.5 lb

## 2021-03-12 DIAGNOSIS — E079 Disorder of thyroid, unspecified: Secondary | ICD-10-CM | POA: Insufficient documentation

## 2021-03-12 DIAGNOSIS — D75839 Thrombocytosis, unspecified: Secondary | ICD-10-CM | POA: Insufficient documentation

## 2021-03-12 DIAGNOSIS — I10 Essential (primary) hypertension: Secondary | ICD-10-CM | POA: Diagnosis not present

## 2021-03-12 DIAGNOSIS — D473 Essential (hemorrhagic) thrombocythemia: Secondary | ICD-10-CM | POA: Diagnosis not present

## 2021-03-12 DIAGNOSIS — Z79899 Other long term (current) drug therapy: Secondary | ICD-10-CM | POA: Insufficient documentation

## 2021-03-12 DIAGNOSIS — Z7982 Long term (current) use of aspirin: Secondary | ICD-10-CM | POA: Diagnosis not present

## 2021-03-12 LAB — CBC WITH DIFFERENTIAL/PLATELET
Abs Immature Granulocytes: 0.05 10*3/uL (ref 0.00–0.07)
Basophils Absolute: 0.1 10*3/uL (ref 0.0–0.1)
Basophils Relative: 1 %
Eosinophils Absolute: 0.2 10*3/uL (ref 0.0–0.5)
Eosinophils Relative: 2 %
HCT: 41.2 % (ref 39.0–52.0)
Hemoglobin: 14.2 g/dL (ref 13.0–17.0)
Immature Granulocytes: 1 %
Lymphocytes Relative: 20 %
Lymphs Abs: 2 10*3/uL (ref 0.7–4.0)
MCH: 33.3 pg (ref 26.0–34.0)
MCHC: 34.5 g/dL (ref 30.0–36.0)
MCV: 96.7 fL (ref 80.0–100.0)
Monocytes Absolute: 0.8 10*3/uL (ref 0.1–1.0)
Monocytes Relative: 8 %
Neutro Abs: 6.9 10*3/uL (ref 1.7–7.7)
Neutrophils Relative %: 68 %
Platelets: 518 10*3/uL — ABNORMAL HIGH (ref 150–400)
RBC: 4.26 MIL/uL (ref 4.22–5.81)
RDW: 16.2 % — ABNORMAL HIGH (ref 11.5–15.5)
WBC: 10 10*3/uL (ref 4.0–10.5)
nRBC: 0 % (ref 0.0–0.2)

## 2021-03-12 NOTE — Progress Notes (Signed)
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 He is here for FU for ET while on hydrea and aspirin. I have reviewed labs with him, noted drop in platelets, will titrate hydrea dose to 500 mg PO TID to target platelet count of 100-400K He will remain on aspirin as well. He has been tolerating Hydrea very well, he would however like to take 1000 mg in AM and 500 mg in PM. This may be reasonable. RTC for labs in 4 weeks, FU with me in 12 weeks. He was encouraged to reach out to [place] with any new questions or concerns.  HISTORY OF PRESENTING ILLNESS:  Howard Green 65 y.o. male is here because of ET   Oncology History   No history exists.   INTERIM HISTORY  Patient is here for a FU on ET. He has been taking hydrea BID and aspirin as recommended. No concerning AE on hydrea He says he has to go see his ophthalmologist for some vision issues. No change in breathing, bowel or urinary habits No new neurological complaints.  MEDICAL HISTORY:  Past Medical History:  Diagnosis Date   Allergy    seasonal like hayfever    Bleeding of eye, bilateral    Hypertension    Thyroid disease    benign cyst removed 15 years ago    SURGICAL HISTORY: Past Surgical History:  Procedure Laterality Date   THYROID SURGERY      SOCIAL HISTORY: Social History   Socioeconomic History   Marital status: Widowed    Spouse name: Not on file   Number of children: Not on file   Years of education: Not on file   Highest education level: Not on file  Occupational History   Not on file  Tobacco Use   Smoking status:  Never   Smokeless tobacco: Never  Vaping Use   Vaping Use: Former  Substance and Sexual Activity   Alcohol use: No    Alcohol/week: 0.0 standard drinks   Drug use: No   Sexual activity: Not on file  Other Topics Concern   Not on file  Social History Narrative   Lives with wife in a one story home.  Has 4 children.     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      Social Determinants of Health   Financial Resource Strain: Not on file  Food Insecurity: Not on file  Transportation Needs: Not on file  Physical Activity: Not on file  Stress: Not on file  Social Connections: Not on file  Intimate Partner Violence: Not on file    FAMILY HISTORY: Family History  Problem Relation Age of Onset   Diabetes Mother    Other Mother        Good Pastures disease   Lung cancer Father        passed 20   Healthy Brother    Healthy Sister    Healthy Son    Healthy Daughter    Colon cancer Neg Hx    Rectal cancer Neg Hx    Stomach cancer Neg Hx     ALLERGIES:  has No Known Allergies.  MEDICATIONS:  Current Outpatient Medications  Medication Sig Dispense Refill   allopurinol (ZYLOPRIM) 300 MG tablet Take 1 tablet (300 mg total) by mouth daily. 30 tablet 6   amLODipine (NORVASC) 5 MG tablet Take 1 tablet (5 mg total) by mouth daily. 90 tablet 3   aspirin 81 MG tablet Take 81 mg by mouth daily.     hydroxyurea (HYDREA) 500 MG capsule Take 1 capsule (500 mg total) by mouth 2 (two) times daily. May take with food to minimize GI side effects. 60 capsule 4   metoprolol succinate (TOPROL XL) 100 MG 24 hr tablet Take 1 tablet (100 mg total) by mouth daily. Take with or immediately following a meal. 90 tablet 3   No current facility-administered medications for this visit.    PHYSICAL EXAMINATION: ECOG PERFORMANCE STATUS: 0 - Asymptomatic  Vitals:   03/12/21 1225  BP: (!) 160/102  Pulse: (!) 58  Resp: 19  Temp: 98.8 F (37.1 C)  SpO2: 99%   Filed Weights   03/12/21 1225   Weight: 255 lb 8 oz (115.9 kg)   Physical Exam Constitutional:      Appearance: Normal appearance.  HENT:     Head: Normocephalic and atraumatic.  Cardiovascular:     Rate and Rhythm: Normal rate and regular rhythm.     Pulses: Normal pulses.     Heart sounds: Normal heart sounds.  Pulmonary:     Effort: Pulmonary effort is normal.     Breath sounds: Normal breath sounds.  Abdominal:     General: Abdomen is flat. Bowel sounds are normal.     Palpations: Abdomen is soft.  Musculoskeletal:        General: No swelling, tenderness or deformity.     Cervical back: Normal range of motion.  Skin:    General: Skin is warm and dry.  Neurological:     General: No focal deficit present.     Mental Status: He is alert.  Psychiatric:        Mood and Affect: Mood normal.        Behavior: Behavior normal.    LABORATORY DATA:  I have reviewed the data as listed Lab Results  Component Value Date   WBC 10.0 03/12/2021   HGB 14.2 03/12/2021   HCT 41.2 03/12/2021   MCV 96.7 03/12/2021   PLT 518 (H) 03/12/2021     Chemistry      Component Value Date/Time   NA 143 02/12/2021 1329   NA 139 06/14/2018 0000   K 4.0 02/12/2021 1329   CL 111 02/12/2021 1329   CO2 23 02/12/2021 1329   BUN 22 02/12/2021 1329   BUN 21 06/14/2018 0000   CREATININE 1.99 (H) 02/12/2021 1329   GLU 98 06/14/2018 0000      Component Value Date/Time   CALCIUM 9.1 02/12/2021 1329   ALKPHOS 64 02/12/2021 1329   AST 15 02/12/2021 1329   ALT 9 02/12/2021 1329   BILITOT 0.9 02/12/2021 1329     Labs from today reviewed.  RADIOGRAPHIC STUDIES: I have personally reviewed the radiological images as listed and agreed with the findings in the report.  All questions were answered. The patient knows to call the clinic with any problems, questions or concerns.    Benay Pike, MD 03/12/2021 12:53 PM

## 2021-03-12 NOTE — Telephone Encounter (Signed)
Scheduled per los. Gave avs and calendar  

## 2021-04-08 ENCOUNTER — Encounter: Payer: Self-pay | Admitting: Hematology and Oncology

## 2021-04-08 ENCOUNTER — Other Ambulatory Visit: Payer: Self-pay

## 2021-04-08 ENCOUNTER — Inpatient Hospital Stay: Payer: Medicare HMO | Attending: Hematology and Oncology | Admitting: Hematology and Oncology

## 2021-04-08 ENCOUNTER — Inpatient Hospital Stay: Payer: Medicare HMO

## 2021-04-08 VITALS — BP 162/97 | HR 91 | Temp 97.9°F | Resp 18 | Ht 72.0 in | Wt 256.5 lb

## 2021-04-08 DIAGNOSIS — D473 Essential (hemorrhagic) thrombocythemia: Secondary | ICD-10-CM | POA: Diagnosis not present

## 2021-04-08 DIAGNOSIS — Z79899 Other long term (current) drug therapy: Secondary | ICD-10-CM | POA: Insufficient documentation

## 2021-04-08 DIAGNOSIS — Z7982 Long term (current) use of aspirin: Secondary | ICD-10-CM | POA: Insufficient documentation

## 2021-04-08 DIAGNOSIS — I10 Essential (primary) hypertension: Secondary | ICD-10-CM | POA: Insufficient documentation

## 2021-04-08 DIAGNOSIS — D75839 Thrombocytosis, unspecified: Secondary | ICD-10-CM | POA: Diagnosis present

## 2021-04-08 DIAGNOSIS — D75838 Other thrombocytosis: Secondary | ICD-10-CM | POA: Insufficient documentation

## 2021-04-08 LAB — CBC WITH DIFFERENTIAL/PLATELET
Abs Immature Granulocytes: 0.03 10*3/uL (ref 0.00–0.07)
Basophils Absolute: 0.1 10*3/uL (ref 0.0–0.1)
Basophils Relative: 1 %
Eosinophils Absolute: 0.1 10*3/uL (ref 0.0–0.5)
Eosinophils Relative: 1 %
HCT: 40.5 % (ref 39.0–52.0)
Hemoglobin: 14.1 g/dL (ref 13.0–17.0)
Immature Granulocytes: 0 %
Lymphocytes Relative: 22 %
Lymphs Abs: 1.7 10*3/uL (ref 0.7–4.0)
MCH: 35.4 pg — ABNORMAL HIGH (ref 26.0–34.0)
MCHC: 34.8 g/dL (ref 30.0–36.0)
MCV: 101.8 fL — ABNORMAL HIGH (ref 80.0–100.0)
Monocytes Absolute: 0.6 10*3/uL (ref 0.1–1.0)
Monocytes Relative: 8 %
Neutro Abs: 5.1 10*3/uL (ref 1.7–7.7)
Neutrophils Relative %: 68 %
Platelets: 319 10*3/uL (ref 150–400)
RBC: 3.98 MIL/uL — ABNORMAL LOW (ref 4.22–5.81)
RDW: 16.9 % — ABNORMAL HIGH (ref 11.5–15.5)
WBC: 7.6 10*3/uL (ref 4.0–10.5)
nRBC: 0 % (ref 0.0–0.2)

## 2021-04-08 MED ORDER — HYDROXYUREA 500 MG PO CAPS: 500.0000 mg | ORAL_CAPSULE | Freq: Three times a day (TID) | ORAL | 4 refills | Status: DC

## 2021-04-08 NOTE — Progress Notes (Signed)
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 He is currently on aspirin and Hydrea.  Hydrea dose is 500 mg thrice a day.   He is tolerating it very well. Physical examination unremarkable, no palpable lymphadenopathy or hepatosplenomegaly. At this time since his platelet count is normal and he is tolerating current dose of Hydrea well, plan is to continue current dose of Hydrea and aspirin and return to clinic in 3 months for follow-up.  He was encouraged to follow-up with his PCP for better management of his hypertension.  He was also requested to call us with any urgent questions or concerns.  Otherwise return to clinic in 3 months with repeat labs.  HISTORY OF PRESENTING ILLNESS:  Howard Green 65 y.o. male is here because of ET   Oncology History   No history exists.   INTERIM HISTORY  Patient is here for a FU on ET. He has been taking hydrea thrice a day and aspirin as recommended.  He sometimes takes 2 tablets of 500 mg in the morning and 1 tablet in the evening, this benefits his schedule, it is hard for him to take it twice a day with his schedule. No side effects with the Hydrea reported.  Tolerating aspirin very well as well. No change in breathing.  No change in bowel habits or urinary habits. Rest of the pertinent 10 point ROS reviewed and negative.  MEDICAL HISTORY:  Past Medical History:  Diagnosis Date   Allergy    seasonal like hayfever    Bleeding of eye, bilateral    Hypertension    Thyroid disease    benign cyst removed 15 years ago    SURGICAL HISTORY: Past Surgical  History:  Procedure Laterality Date   THYROID SURGERY      SOCIAL HISTORY: Social History   Socioeconomic History   Marital status: Widowed    Spouse name: Not on file   Number of children: Not on file   Years of education: Not on file   Highest education level: Not on file  Occupational History   Not on file  Tobacco Use   Smoking status: Never   Smokeless tobacco: Never  Vaping Use   Vaping Use: Former  Substance and Sexual Activity   Alcohol use: No    Alcohol/week: 0.0 standard drinks   Drug use: No   Sexual activity: Not on file  Other Topics Concern   Not on file  Social History Narrative   Lives with wife in a one story home.  Has 4 children.     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      Social Determinants of Health   Financial Resource Strain: Not on file  Food Insecurity: Not on file  Transportation Needs: Not on file  Physical Activity: Not on file  Stress: Not on file  Social Connections: Not on file  Intimate Partner Violence: Not on file    FAMILY HISTORY: Family History  Problem Relation Age of Onset   Diabetes Mother    Other Mother        Good Pastures disease   Lung cancer Father  passed [place] Sister    Healthy Son    Healthy Daughter    Colon cancer Neg Hx    Rectal cancer Neg Hx    Stomach cancer Neg Hx     ALLERGIES:  has No Known Allergies.  MEDICATIONS:  Current Outpatient Medications  Medication Sig Dispense Refill   allopurinol (ZYLOPRIM) 300 MG tablet Take 1 tablet (300 mg total) by mouth daily. 30 tablet 6   amLODipine (NORVASC) 5 MG tablet Take 1 tablet (5 mg total) by mouth daily. 90 tablet 3   aspirin 81 MG tablet Take 81 mg by mouth daily.     hydroxyurea (HYDREA) 500 MG capsule Take 1 capsule (500 mg total) by mouth 3 (three) times daily. May take with food to minimize GI side effects. 90 capsule 4   metoprolol succinate (TOPROL XL) 100 MG 24 hr tablet Take 1 tablet (100  mg total) by mouth daily. Take with or immediately following a meal. 90 tablet 3   No current facility-administered medications for this visit.    PHYSICAL EXAMINATION: ECOG PERFORMANCE STATUS: 0 - Asymptomatic  Vitals:   04/08/21 1203  BP: (!) 162/97  Pulse: 91  Resp: 18  Temp: 97.9 F (36.6 C)  SpO2: 100%   Filed Weights   04/08/21 1203  Weight: 256 lb 8 oz (116.3 kg)   Physical Exam Constitutional:      Appearance: Normal appearance.  HENT:     Head: Normocephalic and atraumatic.  Cardiovascular:     Rate and Rhythm: Normal rate and regular rhythm.     Pulses: Normal pulses.     Heart sounds: Normal heart sounds.  Pulmonary:     Effort: Pulmonary effort is normal.     Breath sounds: Normal breath sounds.  Abdominal:     General: Abdomen is flat. Bowel sounds are normal.     Palpations: Abdomen is soft.  Musculoskeletal:        General: No swelling, tenderness or deformity.     Cervical back: Normal range of motion.  Skin:    General: Skin is warm and dry.  Neurological:     General: No focal deficit present.     Mental Status: He is alert.  Psychiatric:        Mood and Affect: Mood normal.        Behavior: Behavior normal.    LABORATORY DATA:  I have reviewed the data as listed Lab Results  Component Value Date   WBC 7.6 04/08/2021   HGB 14.1 04/08/2021   HCT 40.5 04/08/2021   MCV 101.8 (H) 04/08/2021   PLT 319 04/08/2021     Chemistry      Component Value Date/Time   NA 143 02/12/2021 1329   NA 139 06/14/2018 0000   K 4.0 02/12/2021 1329   CL 111 02/12/2021 1329   CO2 23 02/12/2021 1329   BUN 22 02/12/2021 1329   BUN 21 06/14/2018 0000   CREATININE 1.99 (H) 02/12/2021 1329   GLU 98 06/14/2018 0000      Component Value Date/Time   CALCIUM 9.1 02/12/2021 1329   ALKPHOS 64 02/12/2021 1329   AST 15 02/12/2021 1329   ALT 9 02/12/2021 1329   BILITOT 0.9 02/12/2021 1329     Labs from today reviewed. Platelet count normal.  RADIOGRAPHIC  STUDIES: I have personally reviewed the radiological images as listed and agreed with the findings in the report.  All questions were  answered. The patient knows to call the clinic with any problems, questions or concerns.    Benay Pike, MD 04/08/2021 12:34 PM

## 2021-06-03 NOTE — Progress Notes (Signed)
[name]  Patient Care Team: [name], MD as PCP - General (Internal Medicine)  CHIEF COMPLAINTS/PURPOSE OF CONSULTATION:  ET follow up after hydrea dose modification  ASSESSMENT & PLAN:   This is a very pleasant 65 year old male patient with past medical history significant for hypertension referred to hematology for evaluation of persistent and extreme thrombocytosis, CALR mutated. He is currently on aspirin and Hydrea.  Hydrea dose is 500 mg thrice a day.   No concerning review of systems today.  Physical examination unremarkable, no palpable lymphadenopathy or hepatosplenomegaly.  Back in July, his platelet count was well within target range hence we plan to continue Hydrea 500 mg thrice a day and aspirin and return to clinic for follow-up.  We will repeat labs today.  If labs show platelet count end range between 100-400,000, he can continue the current regimen and return to clinic in about 3 months with repeat labs. He was of course encouraged to contact us sooner with any new questions or concerns.  Thank you for consulting [place] in the care of this patient.  Please not hesitate to contact us with any additional questions or concerns.   HISTORY OF PRESENTING ILLNESS:   Howard Green 65 y.o. male is here because of ET   Oncology History   No history exists.   INTERIM HISTORY  Patient is here for a follow-up.  He is currently taking Hydrea 3 times a day and aspirin 81 mg daily.  He denies any side effects with the Hydrea.  He has been feeling well except for it.  No interim episodes of gout.   No interim hospitalizations.  Blood pressure still not well controlled.  No change in bowel habits or urinary habits.  No new neurological complaints.  Rest of the pertinent 10 point ROS reviewed and negative.     MEDICAL HISTORY:  Past Medical History:  Diagnosis Date   Allergy    seasonal like hayfever    Bleeding of eye, bilateral     Hypertension    Thyroid disease    benign cyst removed 15 years ago    SURGICAL HISTORY: Past Surgical History:  Procedure Laterality Date   THYROID SURGERY      SOCIAL HISTORY: Social History   Socioeconomic History   Marital status: Widowed    Spouse name: Not on file   Number of children: Not on file   Years of education: Not on file   Highest education level: Not on file  Occupational History   Not on file  Tobacco Use   Smoking status: Never   Smokeless tobacco: Never  Vaping Use   Vaping Use: Former  Substance and Sexual Activity   Alcohol use: No    Alcohol/week: 0.0 standard drinks   Drug use: No   Sexual activity: Not on file  Other Topics Concern   Not on file  Social History Narrative   Lives with wife in a one story home.  Has 4 children.     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      Social Determinants of Health   Financial Resource Strain: Not on file  Food Insecurity: Not on file  Transportation Needs: Not on file  Physical Activity: Not on file  Stress: Not on file  Social Connections: Not on file  Intimate Partner Violence: Not on file    FAMILY HISTORY: Family History  Problem Relation Age of Onset   Diabetes Mother  Other Mother        Good Pastures disease   Lung cancer Father        passed 28   Healthy Brother    Healthy Sister    Healthy Son    Healthy Daughter    Colon cancer Neg Hx    Rectal cancer Neg Hx    Stomach cancer Neg Hx     ALLERGIES:  has No Known Allergies.  MEDICATIONS:  Current Outpatient Medications  Medication Sig Dispense Refill   allopurinol (ZYLOPRIM) 300 MG tablet Take 1 tablet (300 mg total) by mouth daily. 30 tablet 6   amLODipine (NORVASC) 5 MG tablet Take 1 tablet (5 mg total) by mouth daily. 90 tablet 3   aspirin 81 MG tablet Take 81 mg by mouth daily.     hydroxyurea (HYDREA) 500 MG capsule Take 1 capsule (500 mg total) by mouth 3 (three) times daily. May take with food to  minimize GI side effects. 90 capsule 4   metoprolol succinate (TOPROL XL) 100 MG 24 hr tablet Take 1 tablet (100 mg total) by mouth daily. Take with or immediately following a meal. 90 tablet 3   No current facility-administered medications for this visit.    PHYSICAL EXAMINATION: ECOG PERFORMANCE STATUS: 0 - Asymptomatic  Vitals:   06/04/21 1257  BP: (!) 164/82  Pulse: 99  Temp: (!) 97.5 F (36.4 C)  SpO2: 100%   Filed Weights   06/04/21 1257  Weight: 255 lb 4 oz (115.8 kg)   Physical Exam Constitutional:      Appearance: Normal appearance.  HENT:     Head: Normocephalic and atraumatic.  Cardiovascular:     Rate and Rhythm: Normal rate and regular rhythm.     Pulses: Normal pulses.     Heart sounds: Normal heart sounds.  Pulmonary:     Effort: Pulmonary effort is normal.     Breath sounds: Normal breath sounds.  Abdominal:     General: Abdomen is flat. Bowel sounds are normal.     Palpations: Abdomen is soft.  Musculoskeletal:        General: No swelling, tenderness or deformity.     Cervical back: Normal range of motion.  Skin:    General: Skin is warm and dry.  Neurological:     General: No focal deficit present.     Mental Status: He is alert.  Psychiatric:        Mood and Affect: Mood normal.        Behavior: Behavior normal.    LABORATORY DATA:  I have reviewed the data as listed Lab Results  Component Value Date   WBC 7.6 04/08/2021   HGB 14.1 04/08/2021   HCT 40.5 04/08/2021   MCV 101.8 (H) 04/08/2021   PLT 319 04/08/2021     Chemistry      Component Value Date/Time   NA 143 02/12/2021 1329   NA 139 06/14/2018 0000   K 4.0 02/12/2021 1329   CL 111 02/12/2021 1329   CO2 23 02/12/2021 1329   BUN 22 02/12/2021 1329   BUN 21 06/14/2018 0000   CREATININE 1.99 (H) 02/12/2021 1329   GLU 98 06/14/2018 0000      Component Value Date/Time   CALCIUM 9.1 02/12/2021 1329   ALKPHOS 64 02/12/2021 1329   AST 15 02/12/2021 1329   ALT 9 02/12/2021  1329   BILITOT 0.9 02/12/2021 1329     Labs from today are pending.  Labs from July  are satisfactory with platelet count in target range. RADIOGRAPHIC STUDIES: I have personally reviewed the radiological images as listed and agreed with the findings in the report.  All questions were answered. The patient knows to call the clinic with any problems, questions or concerns.    Benay Pike, MD 06/04/2021 1:30 PM

## 2021-06-04 ENCOUNTER — Telehealth: Payer: Self-pay

## 2021-06-04 ENCOUNTER — Encounter: Payer: Self-pay | Admitting: Hematology and Oncology

## 2021-06-04 ENCOUNTER — Inpatient Hospital Stay: Payer: Medicare HMO

## 2021-06-04 ENCOUNTER — Other Ambulatory Visit: Payer: Self-pay

## 2021-06-04 ENCOUNTER — Inpatient Hospital Stay: Payer: Medicare HMO | Attending: Hematology and Oncology | Admitting: Hematology and Oncology

## 2021-06-04 VITALS — BP 164/82 | HR 99 | Temp 97.5°F | Wt 255.2 lb

## 2021-06-04 DIAGNOSIS — Z801 Family history of malignant neoplasm of trachea, bronchus and lung: Secondary | ICD-10-CM | POA: Insufficient documentation

## 2021-06-04 DIAGNOSIS — D473 Essential (hemorrhagic) thrombocythemia: Secondary | ICD-10-CM

## 2021-06-04 DIAGNOSIS — I10 Essential (primary) hypertension: Secondary | ICD-10-CM | POA: Insufficient documentation

## 2021-06-04 DIAGNOSIS — D75839 Thrombocytosis, unspecified: Secondary | ICD-10-CM | POA: Insufficient documentation

## 2021-06-04 LAB — CBC WITH DIFFERENTIAL/PLATELET
Abs Immature Granulocytes: 0.03 10*3/uL (ref 0.00–0.07)
Basophils Absolute: 0.1 10*3/uL (ref 0.0–0.1)
Basophils Relative: 1 %
Eosinophils Absolute: 0.1 10*3/uL (ref 0.0–0.5)
Eosinophils Relative: 1 %
HCT: 38.9 % — ABNORMAL LOW (ref 39.0–52.0)
Hemoglobin: 13.8 g/dL (ref 13.0–17.0)
Immature Granulocytes: 0 %
Lymphocytes Relative: 20 %
Lymphs Abs: 1.6 10*3/uL (ref 0.7–4.0)
MCH: 38.4 pg — ABNORMAL HIGH (ref 26.0–34.0)
MCHC: 35.5 g/dL (ref 30.0–36.0)
MCV: 108.4 fL — ABNORMAL HIGH (ref 80.0–100.0)
Monocytes Absolute: 0.8 10*3/uL (ref 0.1–1.0)
Monocytes Relative: 10 %
Neutro Abs: 5.2 10*3/uL (ref 1.7–7.7)
Neutrophils Relative %: 68 %
Platelets: 243 10*3/uL (ref 150–400)
RBC: 3.59 MIL/uL — ABNORMAL LOW (ref 4.22–5.81)
RDW: 14.2 % (ref 11.5–15.5)
WBC: 7.7 10*3/uL (ref 4.0–10.5)
nRBC: 0 % (ref 0.0–0.2)

## 2021-06-04 LAB — CMP (CANCER CENTER ONLY)
ALT: 11 U/L (ref 0–44)
AST: 17 U/L (ref 15–41)
Albumin: 4 g/dL (ref 3.5–5.0)
Alkaline Phosphatase: 62 U/L (ref 38–126)
Anion gap: 10 (ref 5–15)
BUN: 23 mg/dL (ref 8–23)
CO2: 21 mmol/L — ABNORMAL LOW (ref 22–32)
Calcium: 9.1 mg/dL (ref 8.9–10.3)
Chloride: 112 mmol/L — ABNORMAL HIGH (ref 98–111)
Creatinine: 1.87 mg/dL — ABNORMAL HIGH (ref 0.61–1.24)
GFR, Estimated: 39 mL/min — ABNORMAL LOW (ref >60)
Glucose, Bld: 99 mg/dL (ref 70–99)
Potassium: 4 mmol/L (ref 3.5–5.1)
Sodium: 143 mmol/L (ref 135–145)
Total Bilirubin: 1.4 mg/dL — ABNORMAL HIGH (ref 0.3–1.2)
Total Protein: 7.1 g/dL (ref 6.5–8.1)

## 2021-06-04 NOTE — Telephone Encounter (Signed)
Called patient and discussed that, per Dr Chryl Heck, platelet counts look good and it's ok to f/u in 3 months. Patient verbalized understanding. F/u appt scheduled.

## 2021-06-11 ENCOUNTER — Telehealth: Payer: Self-pay | Admitting: Internal Medicine

## 2021-06-14 ENCOUNTER — Other Ambulatory Visit: Payer: Self-pay | Admitting: Internal Medicine

## 2021-06-16 MED ORDER — ALLOPURINOL 300 MG PO TABS: ORAL_TABLET | ORAL | 6 refills | Status: DC

## 2021-06-16 NOTE — Telephone Encounter (Signed)
Refill has been sent in.  

## 2021-06-16 NOTE — Telephone Encounter (Signed)
Patient does not have his medication the pharmacy gave him a 3 day supply  Please re-send the refill and contact the patient  allopurinol (ZYLOPRIM) 300 MG tablet

## 2021-06-16 NOTE — Addendum Note (Signed)
Addended by: Thomes Cake on: 06/16/2021 02:09 PM   Modules accepted: Orders

## 2021-06-30 ENCOUNTER — Ambulatory Visit: Payer: Medicare HMO | Admitting: Internal Medicine

## 2021-07-07 ENCOUNTER — Other Ambulatory Visit: Payer: Self-pay

## 2021-07-07 ENCOUNTER — Ambulatory Visit (INDEPENDENT_AMBULATORY_CARE_PROVIDER_SITE_OTHER): Payer: Medicare HMO | Admitting: Internal Medicine

## 2021-07-07 ENCOUNTER — Encounter: Payer: Self-pay | Admitting: Internal Medicine

## 2021-07-07 VITALS — BP 130/80 | HR 109 | Temp 97.9°F | Resp 18 | Ht 72.0 in | Wt 255.4 lb

## 2021-07-07 DIAGNOSIS — M109 Gout, unspecified: Secondary | ICD-10-CM

## 2021-07-07 DIAGNOSIS — N1832 Chronic kidney disease, stage 3b: Secondary | ICD-10-CM | POA: Diagnosis not present

## 2021-07-07 DIAGNOSIS — Z0000 Encounter for general adult medical examination without abnormal findings: Secondary | ICD-10-CM | POA: Diagnosis not present

## 2021-07-07 NOTE — Progress Notes (Signed)
Subjective:   Patient ID: Howard Green, male    DOB: 1956-03-17, 65 y.o.   MRN: [id number]  HPI Here for medicare wellness and physical, no new complaints. Please see A/P for status and treatment of chronic medical problems.   Diet: heart healthy Physical activity: sedentary Depression/mood screen: negative Hearing: intact to whispered voice Visual acuity: grossly normal with lens, overdue for annual eye exam  ADLs: capable Fall risk: none Home safety: good Cognitive evaluation: intact to orientation, naming, recall and repetition EOL planning: adv directives discussed  [place] Visit from 09/19/2020 in [place] at [facility]  PHQ-2 Total Score [place] Office Visit from 09/19/2020 in [place] at [facility]  PHQ-9 Total Score 0      Fall Risk  09/19/2020  Falls in the past year? 0  Number falls in past yr: 0  Injury with Fall? 0    I have personally reviewed and have noted 1. The patient's medical and social history - reviewed today no changes 2. Their use of alcohol, tobacco or illicit drugs 3. Their current medications and supplements 4. The patient's functional ability including ADL's, fall risks, home safety risks and hearing or visual impairment. 5. Diet and physical activities 6. Evidence for depression or mood disorders 7. Care team reviewed and updated 8.  The patient is not on an opioid pain medication.  Patient Care Team: [name], MD as PCP - General (Internal Medicine) Past Medical History:  Diagnosis Date   Allergy    seasonal like hayfever    Bleeding of eye, bilateral    Hypertension    Thyroid disease    benign cyst removed 15 years ago   Past Surgical History:  Procedure Laterality Date   THYROID SURGERY     Family History  Problem Relation Age of Onset   Diabetes Mother    Other Mother        Good Pastures disease   Lung cancer Father        passed 37   Healthy  Brother    Healthy Sister    Healthy Son    Healthy Daughter    Colon cancer Neg Hx    Rectal cancer Neg Hx    Stomach cancer Neg Hx      Review of Systems  Constitutional: Negative.   HENT: Negative.    Eyes: Negative.   Respiratory:  Negative for cough, chest tightness and shortness of breath.   Cardiovascular:  Negative for chest pain, palpitations and leg swelling.  Gastrointestinal:  Negative for abdominal distention, abdominal pain, constipation, diarrhea, nausea and vomiting.  Musculoskeletal: Negative.   Skin: Negative.   Neurological: Negative.   Psychiatric/Behavioral: Negative.     Objective:  Physical Exam Constitutional:      Appearance: He is well-developed.  HENT:     Head: Normocephalic and atraumatic.  Cardiovascular:     Rate and Rhythm: Normal rate and regular rhythm.  Pulmonary:     Effort: Pulmonary effort is normal. No respiratory distress.     Breath sounds: Normal breath sounds. No wheezing or rales.  Abdominal:     General: Bowel sounds are normal. There is no distension.     Palpations: Abdomen is soft.     Tenderness: There is no abdominal tenderness. There is no rebound.  Musculoskeletal:     Cervical back: Normal range of motion.  Skin:    General: Skin is warm and dry.  Neurological:     Mental Status: He is alert and oriented to person, place, and time.     Coordination: Coordination normal.    Vitals:   07/07/21 1257  BP: 130/80  Pulse: (!) 109  Resp: 18  Temp: 97.9 F (36.6 C)  TempSrc: Oral  SpO2: 97%  Weight: 255 lb 6.4 oz (115.8 kg)  Height: 6' (1.829 m)    This visit occurred during the SARS-CoV-2 public health emergency.  Safety protocols were in place, including screening questions prior to the visit, additional usage of staff PPE, and extensive cleaning of exam room while observing appropriate contact time as indicated for disinfecting solutions.   Assessment & Plan:

## 2021-07-09 ENCOUNTER — Ambulatory Visit: Payer: Medicare HMO | Admitting: Hematology and Oncology

## 2021-07-09 ENCOUNTER — Other Ambulatory Visit: Payer: Medicare HMO

## 2021-07-10 DIAGNOSIS — Z0000 Encounter for general adult medical examination without abnormal findings: Secondary | ICD-10-CM | POA: Insufficient documentation

## 2021-07-10 NOTE — Assessment & Plan Note (Signed)
Doing well on allopurinol 300 mg daily and no gout flare recently.

## 2021-07-10 NOTE — Assessment & Plan Note (Signed)
Flu shot counseled. Covid-19 booster counseled. Pneumonia due declines. Shingrix due declines. Tetanus due 2026. Colonoscopy due declines. Counseled about sun safety and mole surveillance. Counseled about the dangers of distracted driving. Given 10 year screening recommendations.

## 2021-07-10 NOTE — Assessment & Plan Note (Signed)
Continues regular monitoring and BP at goal. No known diabetes.

## 2021-09-30 ENCOUNTER — Other Ambulatory Visit: Payer: Self-pay

## 2021-09-30 ENCOUNTER — Inpatient Hospital Stay: Payer: Medicare HMO | Attending: Hematology and Oncology | Admitting: Hematology and Oncology

## 2021-09-30 ENCOUNTER — Inpatient Hospital Stay: Payer: Medicare HMO

## 2021-09-30 ENCOUNTER — Encounter: Payer: Self-pay | Admitting: Hematology and Oncology

## 2021-09-30 VITALS — BP 161/96 | HR 66 | Temp 98.4°F | Resp 16 | Wt 256.4 lb

## 2021-09-30 DIAGNOSIS — Z801 Family history of malignant neoplasm of trachea, bronchus and lung: Secondary | ICD-10-CM | POA: Diagnosis not present

## 2021-09-30 DIAGNOSIS — D75839 Thrombocytosis, unspecified: Secondary | ICD-10-CM | POA: Insufficient documentation

## 2021-09-30 DIAGNOSIS — D473 Essential (hemorrhagic) thrombocythemia: Secondary | ICD-10-CM

## 2021-09-30 DIAGNOSIS — I10 Essential (primary) hypertension: Secondary | ICD-10-CM | POA: Insufficient documentation

## 2021-09-30 LAB — CBC WITH DIFFERENTIAL/PLATELET
Abs Immature Granulocytes: 0.03 10*3/uL (ref 0.00–0.07)
Basophils Absolute: 0.1 10*3/uL (ref 0.0–0.1)
Basophils Relative: 1 %
Eosinophils Absolute: 0.1 10*3/uL (ref 0.0–0.5)
Eosinophils Relative: 1 %
HCT: 39.1 % (ref 39.0–52.0)
Hemoglobin: 13.6 g/dL (ref 13.0–17.0)
Immature Granulocytes: 0 %
Lymphocytes Relative: 21 %
Lymphs Abs: 1.6 10*3/uL (ref 0.7–4.0)
MCH: 37.7 pg — ABNORMAL HIGH (ref 26.0–34.0)
MCHC: 34.8 g/dL (ref 30.0–36.0)
MCV: 108.3 fL — ABNORMAL HIGH (ref 80.0–100.0)
Monocytes Absolute: 0.6 10*3/uL (ref 0.1–1.0)
Monocytes Relative: 9 %
Neutro Abs: 5 10*3/uL (ref 1.7–7.7)
Neutrophils Relative %: 68 %
Platelets: 257 10*3/uL (ref 150–400)
RBC: 3.61 MIL/uL — ABNORMAL LOW (ref 4.22–5.81)
RDW: 12.9 % (ref 11.5–15.5)
WBC: 7.3 10*3/uL (ref 4.0–10.5)
nRBC: 0 % (ref 0.0–0.2)

## 2021-09-30 NOTE — Progress Notes (Signed)
[facility] FOLLOW UP NOTE  Patient Care Team: [name], MD as PCP - General (Internal Medicine)  CHIEF COMPLAINTS/PURPOSE OF CONSULTATION:  ET follow up after hydrea dose modification  ASSESSMENT & PLAN:   This is a very pleasant 65 year old male patient with past medical history significant for hypertension referred to hematology for evaluation of persistent and extreme thrombocytosis, CALR mutated. He is currently on aspirin and Hydrea.  Hydrea dose is 500 mg thrice a day.   ROS unremarkable today No concerns on physical exam. I reviewed his CBC from today, satisfactory, platelet count is within normal limits. We will continue follow up with lab every 4 months. He was encouraged to call us with any new questions or concerns.  HISTORY OF PRESENTING ILLNESS:   Mattew Green 65 y.o. male is here because of ET   Oncology History   No history exists.   INTERIM HISTORY  Patient is here for a follow-up.  He is currently taking Hydrea 3 times a day and aspirin 81 mg daily.  He denies any side effects with the Hydrea.   No interim hospitalizations. No frequent gout attacks since he started taking allopurinol He is very physically active. No change in breathing, bowel or urinary habits Rest of the pertinent 10 point ROS reviewed and neg.  MEDICAL HISTORY:  Past Medical History:  Diagnosis Date   Allergy    seasonal like hayfever    Bleeding of eye, bilateral    Hypertension    Thyroid disease    benign cyst removed 15 years ago    SURGICAL HISTORY: Past Surgical History:  Procedure Laterality Date   THYROID SURGERY      SOCIAL HISTORY: Social History   Socioeconomic History   Marital status: Widowed    Spouse name: Not on file   Number of children: Not on file   Years of education: Not on file   Highest education level: Not on file  Occupational History   Not on file  Tobacco Use   Smoking status: Never   Smokeless tobacco:  Never  Vaping Use   Vaping Use: Former  Substance and Sexual Activity   Alcohol use: No    Alcohol/week: 0.0 standard drinks   Drug use: No   Sexual activity: Not on file  Other Topics Concern   Not on file  Social History Narrative   Lives with wife in a one story home.  Has 4 children.     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      Social Determinants of Health   Financial Resource Strain: Not on file  Food Insecurity: Not on file  Transportation Needs: Not on file  Physical Activity: Not on file  Stress: Not on file  Social Connections: Not on file  Intimate Partner Violence: Not on file    FAMILY HISTORY: Family History  Problem Relation Age of Onset   Diabetes Mother    Other Mother        Good Pastures disease   Lung cancer Father        passed 60   Healthy Brother    Healthy Sister    Healthy Son    Healthy Daughter    Colon cancer Neg Hx    Rectal cancer Neg Hx    Stomach cancer Neg Hx     ALLERGIES:  has No Known Allergies.  MEDICATIONS:  Current Outpatient Medications  Medication Sig Dispense Refill   allopurinol (ZYLOPRIM) 300  MG tablet TAKE 1 TABLET(300 MG) BY MOUTH DAILY 30 tablet 6   amLODipine (NORVASC) 5 MG tablet Take 1 tablet (5 mg total) by mouth daily. 90 tablet 3   aspirin 81 MG tablet Take 81 mg by mouth daily.     hydroxyurea (HYDREA) 500 MG capsule Take 1 capsule (500 mg total) by mouth 3 (three) times daily. May take with food to minimize GI side effects. 90 capsule 4   metoprolol succinate (TOPROL XL) 100 MG 24 hr tablet Take 1 tablet (100 mg total) by mouth daily. Take with or immediately following a meal. 90 tablet 3   No current facility-administered medications for this visit.    PHYSICAL EXAMINATION: ECOG PERFORMANCE STATUS: 0 - Asymptomatic  Vitals:   09/30/21 1223  BP: (!) 161/96  Pulse: 66  Resp: 16  Temp: 98.4 F (36.9 C)  SpO2: 100%   Filed Weights   09/30/21 1223  Weight: 256 lb 6 oz (116.3 kg)    Physical Exam Constitutional:      Appearance: Normal appearance.  HENT:     Head: Normocephalic and atraumatic.  Cardiovascular:     Rate and Rhythm: Normal rate and regular rhythm.     Pulses: Normal pulses.     Heart sounds: Normal heart sounds.  Pulmonary:     Effort: Pulmonary effort is normal.     Breath sounds: Normal breath sounds.  Abdominal:     General: Abdomen is flat. Bowel sounds are normal.     Palpations: Abdomen is soft.  Musculoskeletal:        General: No swelling, tenderness or deformity.     Cervical back: Normal range of motion.  Skin:    General: Skin is warm and dry.  Neurological:     General: No focal deficit present.     Mental Status: He is alert.  Psychiatric:        Mood and Affect: Mood normal.        Behavior: Behavior normal.    LABORATORY DATA:  I have reviewed the data as listed Lab Results  Component Value Date   WBC 7.3 09/30/2021   HGB 13.6 09/30/2021   HCT 39.1 09/30/2021   MCV 108.3 (H) 09/30/2021   PLT 257 09/30/2021     Chemistry      Component Value Date/Time   NA 143 06/04/2021 1328   NA 139 06/14/2018 0000   K 4.0 06/04/2021 1328   CL 112 (H) 06/04/2021 1328   CO2 21 (L) 06/04/2021 1328   BUN 23 06/04/2021 1328   BUN 21 06/14/2018 0000   CREATININE 1.87 (H) 06/04/2021 1328   GLU 98 06/14/2018 0000      Component Value Date/Time   CALCIUM 9.1 06/04/2021 1328   ALKPHOS 62 06/04/2021 1328   AST 17 06/04/2021 1328   ALT 11 06/04/2021 1328   BILITOT 1.4 (H) 06/04/2021 1328     Labs from today reviewed and satisfactory.  RADIOGRAPHIC STUDIES: I have personally reviewed the radiological images as listed and agreed with the findings in the report.  All questions were answered. The patient knows to call the clinic with any problems, questions or concerns.    Benay Pike, MD 09/30/2021 1:31 PM

## 2021-10-23 ENCOUNTER — Other Ambulatory Visit: Payer: Self-pay | Admitting: *Deleted

## 2021-10-23 MED ORDER — HYDROXYUREA 500 MG PO CAPS: 500.0000 mg | ORAL_CAPSULE | Freq: Three times a day (TID) | ORAL | 4 refills | Status: DC

## 2021-11-04 ENCOUNTER — Other Ambulatory Visit: Payer: Self-pay | Admitting: Internal Medicine

## 2021-12-24 ENCOUNTER — Other Ambulatory Visit: Payer: Self-pay | Admitting: Internal Medicine

## 2021-12-28 ENCOUNTER — Other Ambulatory Visit: Payer: Self-pay | Admitting: Internal Medicine

## 2022-01-06 ENCOUNTER — Ambulatory Visit: Payer: Medicare HMO | Admitting: Internal Medicine

## 2022-01-19 ENCOUNTER — Ambulatory Visit: Payer: Medicare HMO | Admitting: Internal Medicine

## 2022-01-26 ENCOUNTER — Ambulatory Visit: Payer: Medicare HMO | Admitting: Hematology and Oncology

## 2022-01-26 ENCOUNTER — Other Ambulatory Visit: Payer: Medicare HMO

## 2022-01-26 ENCOUNTER — Encounter: Payer: Self-pay | Admitting: Internal Medicine

## 2022-01-26 ENCOUNTER — Ambulatory Visit (INDEPENDENT_AMBULATORY_CARE_PROVIDER_SITE_OTHER): Payer: Medicare HMO | Admitting: Internal Medicine

## 2022-01-26 VITALS — BP 122/84 | HR 101 | Resp 18 | Ht 72.0 in | Wt 260.8 lb

## 2022-01-26 DIAGNOSIS — M109 Gout, unspecified: Secondary | ICD-10-CM | POA: Diagnosis not present

## 2022-01-26 DIAGNOSIS — N1832 Chronic kidney disease, stage 3b: Secondary | ICD-10-CM | POA: Diagnosis not present

## 2022-01-26 DIAGNOSIS — I10 Essential (primary) hypertension: Secondary | ICD-10-CM | POA: Diagnosis not present

## 2022-01-26 DIAGNOSIS — I4819 Other persistent atrial fibrillation: Secondary | ICD-10-CM | POA: Diagnosis not present

## 2022-01-26 DIAGNOSIS — D75839 Thrombocytosis, unspecified: Secondary | ICD-10-CM

## 2022-01-26 LAB — RENAL FUNCTION PANEL
Albumin: 4.3 g/dL (ref 3.5–5.2)
BUN: 21 mg/dL (ref 6–23)
CO2: 26 mEq/L (ref 19–32)
Calcium: 9.4 mg/dL (ref 8.4–10.5)
Chloride: 105 mEq/L (ref 96–112)
Creatinine, Ser: 1.64 mg/dL — ABNORMAL HIGH (ref 0.40–1.50)
GFR: 43.24 mL/min — ABNORMAL LOW (ref >60.00)
Glucose, Bld: 81 mg/dL (ref 70–99)
Phosphorus: 2.9 mg/dL (ref 2.3–4.6)
Potassium: 4.1 mEq/L (ref 3.5–5.1)
Sodium: 140 mEq/L (ref 135–145)

## 2022-01-26 LAB — CBC WITH DIFFERENTIAL/PLATELET
Basophils Absolute: 0.1 10*3/uL (ref 0.0–0.1)
Basophils Relative: 0.7 % (ref 0.0–3.0)
Eosinophils Absolute: 0.1 10*3/uL (ref 0.0–0.7)
Eosinophils Relative: 0.6 % (ref 0.0–5.0)
HCT: 45.3 % (ref 39.0–52.0)
Hemoglobin: 15.2 g/dL (ref 13.0–17.0)
Lymphocytes Relative: 16.4 % (ref 12.0–46.0)
Lymphs Abs: 1.9 10*3/uL (ref 0.7–4.0)
MCHC: 33.4 g/dL (ref 30.0–36.0)
MCV: 102.7 fl — ABNORMAL HIGH (ref 78.0–100.0)
Monocytes Absolute: 1.1 10*3/uL — ABNORMAL HIGH (ref 0.1–1.0)
Monocytes Relative: 9.4 % (ref 3.0–12.0)
Neutro Abs: 8.5 10*3/uL — ABNORMAL HIGH (ref 1.4–7.7)
Neutrophils Relative %: 72.9 % (ref 43.0–77.0)
Platelets: 406 10*3/uL — ABNORMAL HIGH (ref 150.0–400.0)
RBC: 4.41 Mil/uL (ref 4.22–5.81)
RDW: 14.2 % (ref 11.5–15.5)
WBC: 11.6 10*3/uL — ABNORMAL HIGH (ref 4.0–10.5)

## 2022-01-26 LAB — HEPATIC FUNCTION PANEL
ALT: 11 U/L (ref 0–53)
AST: 16 U/L (ref 0–37)
Albumin: 4.3 g/dL (ref 3.5–5.2)
Alkaline Phosphatase: 65 U/L (ref 39–117)
Bilirubin, Direct: 0.2 mg/dL (ref 0.0–0.3)
Total Bilirubin: 0.9 mg/dL (ref 0.2–1.2)
Total Protein: 7.4 g/dL (ref 6.0–8.3)

## 2022-01-26 LAB — URIC ACID: Uric Acid, Serum: 5.4 mg/dL (ref 4.0–7.8)

## 2022-01-26 NOTE — Assessment & Plan Note (Signed)
Checking CBC with diff today so he can avoid labs next week. Is taking hydroxyurea 500 mg TID.  ?

## 2022-01-26 NOTE — Assessment & Plan Note (Signed)
Checking uric acid and adjust allopurinol 300 mg daily as needed. No gout flares in the last 6 months.  ?

## 2022-01-26 NOTE — Assessment & Plan Note (Signed)
Is rate controlled on metoprolol 100 mg daily. Does not desire NOAC and we have counseled about stroke risk in the past about 3% per year. Is taking aspirin 81 mg daily.  ?

## 2022-01-26 NOTE — Assessment & Plan Note (Signed)
Stage 3b and caused by uncontrolled hypertension due to lack of healthcare. Stable for last 1-2 years with good BP control.  ?

## 2022-01-26 NOTE — Assessment & Plan Note (Signed)
BP at goal on amlodipine 5 mg daily and metoprolol 100 mg daily. Checking CMP and complicated by CKD stage 3b. ?

## 2022-01-26 NOTE — Patient Instructions (Signed)
We will check the labs today. 

## 2022-01-26 NOTE — Progress Notes (Signed)
? ?  Subjective:  ? ?Patient ID: Howard Green, male    DOB: 1956/07/25, 66 y.o.   MRN: [id number] ? ?HPI ?The patient is a 66 YO man coming in for follow up. ? ?Review of Systems  ?Constitutional: Negative.   ?HENT: Negative.    ?Eyes: Negative.   ?Respiratory:  Negative for cough, chest tightness and shortness of breath.   ?Cardiovascular:  Negative for chest pain, palpitations and leg swelling.  ?Gastrointestinal:  Negative for abdominal distention, abdominal pain, constipation, diarrhea, nausea and vomiting.  ?Musculoskeletal: Negative.   ?Skin: Negative.   ?Neurological: Negative.   ?Psychiatric/Behavioral: Negative.    ? ?Objective:  ?Physical Exam ?Constitutional:   ?   Appearance: He is well-developed.  ?HENT:  ?   Head: Normocephalic and atraumatic.  ?Cardiovascular:  ?   Rate and Rhythm: Normal rate. Rhythm irregular.  ?Pulmonary:  ?   Effort: Pulmonary effort is normal. No respiratory distress.  ?   Breath sounds: Normal breath sounds. No wheezing or rales.  ?Abdominal:  ?   General: Bowel sounds are normal. There is no distension.  ?   Palpations: Abdomen is soft.  ?   Tenderness: There is no abdominal tenderness. There is no rebound.  ?Musculoskeletal:  ?   Cervical back: Normal range of motion.  ?Skin: ?   General: Skin is warm and dry.  ?Neurological:  ?   Mental Status: He is alert and oriented to person, place, and time.  ?   Coordination: Coordination normal.  ? ? ?Vitals:  ? 01/26/22 1331  ?BP: 122/84  ?Pulse: (!) 101  ?Resp: 18  ?SpO2: 96%  ?Weight: 260 lb 12.8 oz (118.3 kg)  ?Height: 6' (1.829 m)  ? ? ?This visit occurred during the SARS-CoV-2 public health emergency.  Safety protocols were in place, including screening questions prior to the visit, additional usage of staff PPE, and extensive cleaning of exam room while observing appropriate contact time as indicated for disinfecting solutions.  ? ?Assessment & Plan:  ? ?

## 2022-01-29 ENCOUNTER — Telehealth: Payer: Self-pay | Admitting: Hematology and Oncology

## 2022-01-29 NOTE — Telephone Encounter (Signed)
Rescheduled appointment per providers. Patient aware.  ? ?

## 2022-02-03 ENCOUNTER — Other Ambulatory Visit: Payer: Medicare HMO

## 2022-02-03 ENCOUNTER — Ambulatory Visit: Payer: Medicare HMO | Admitting: Hematology and Oncology

## 2022-02-04 ENCOUNTER — Ambulatory Visit: Payer: Medicare HMO | Admitting: Hematology and Oncology

## 2022-03-03 ENCOUNTER — Other Ambulatory Visit: Payer: Self-pay

## 2022-03-03 ENCOUNTER — Inpatient Hospital Stay: Payer: Medicare HMO | Attending: Hematology and Oncology | Admitting: Hematology and Oncology

## 2022-03-03 ENCOUNTER — Encounter: Payer: Self-pay | Admitting: Hematology and Oncology

## 2022-03-03 VITALS — BP 160/108 | HR 90 | Temp 98.1°F | Resp 18 | Ht 72.0 in | Wt 263.5 lb

## 2022-03-03 DIAGNOSIS — I10 Essential (primary) hypertension: Secondary | ICD-10-CM | POA: Diagnosis not present

## 2022-03-03 DIAGNOSIS — D72829 Elevated white blood cell count, unspecified: Secondary | ICD-10-CM | POA: Insufficient documentation

## 2022-03-03 DIAGNOSIS — Z87891 Personal history of nicotine dependence: Secondary | ICD-10-CM | POA: Insufficient documentation

## 2022-03-03 DIAGNOSIS — D473 Essential (hemorrhagic) thrombocythemia: Secondary | ICD-10-CM | POA: Diagnosis not present

## 2022-03-03 DIAGNOSIS — Z801 Family history of malignant neoplasm of trachea, bronchus and lung: Secondary | ICD-10-CM | POA: Diagnosis not present

## 2022-03-03 DIAGNOSIS — D75839 Thrombocytosis, unspecified: Secondary | ICD-10-CM | POA: Insufficient documentation

## 2022-03-03 NOTE — Progress Notes (Signed)
[name] FOLLOW UP NOTE  Patient Care Team: [name], MD as PCP - General (Internal Medicine)  CHIEF COMPLAINTS/PURPOSE OF CONSULTATION:  ET follow up after hydrea dose modification  ASSESSMENT & PLAN:   This is a very pleasant 66 year old male patient with past medical history significant for hypertension referred to hematology for evaluation of persistent and extreme thrombocytosis, CALR mutated. He is currently on aspirin and Hydrea.  Hydrea dose is 500 mg thrice a day.   He is unable to take Hydrea 3 times a day as recommended.  Recent CBC from April shows worsening leukocytosis, thrombocytosis and lower MCV consistent with noncompliance with Hydrea.  I have once again recommended that he try to take 500 mg of Hydrea thrice a day.  He is willing to take 500 mg in a.m. and 1000 mg in p.m. which is reasonable if he cannot comply with the earlier recommended regimen.  He has no adverse effects with Hydrea so far.  Physical examination today noticed weight gain and hypertension which is clinically asymptomatic.  He has poorly controlled hypertension overall, I recommended that he talk to his PCP on multiple occasions to to uptitrate Norvasc.  He will call them once again.  From ET standpoint, he will continue Hydrea and aspirin as recommended and return to clinic in 3 to 4 months.  HISTORY OF PRESENTING ILLNESS:   Howard Green 66 y.o. male is here because of ET   Oncology History   No history exists.   INTERIM HISTORY  Patient is here for a follow-up.  He is not able to take hydrea TID, he says with his schedule ,TID is difficult. He had not had any recent gout flares. He is very physically active. Appetite is good, he has gained about 10 lbs of weight since last time. No change in breathing, bowel or urinary habits Rest of the pertinent 10 point ROS reviewed and neg.  MEDICAL HISTORY:  Past Medical History:  Diagnosis Date   Allergy    seasonal  like hayfever    Bleeding of eye, bilateral    Hypertension    Thyroid disease    benign cyst removed 15 years ago    SURGICAL HISTORY: Past Surgical History:  Procedure Laterality Date   THYROID SURGERY      SOCIAL HISTORY: Social History   Socioeconomic History   Marital status: Widowed    Spouse name: Not on file   Number of children: Not on file   Years of education: Not on file   Highest education level: Not on file  Occupational History   Not on file  Tobacco Use   Smoking status: Never   Smokeless tobacco: Never  Vaping Use   Vaping Use: Former  Substance and Sexual Activity   Alcohol use: No    Alcohol/week: 0.0 standard drinks   Drug use: No   Sexual activity: Not on file  Other Topics Concern   Not on file  Social History Narrative   Lives with wife in a one story home.  Has 4 children.     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      Social Determinants of Health   Financial Resource Strain: Not on file  Food Insecurity: Not on file  Transportation Needs: Not on file  Physical Activity: Not on file  Stress: Not on file  Social Connections: Not on file  Intimate Partner Violence: Not on file    FAMILY HISTORY: Family History  Problem Relation Age of Onset   Diabetes Mother    Other Mother        Good Pastures disease   Lung cancer Father        passed 65   Healthy Brother    Healthy Sister    Healthy Son    Healthy Daughter    Colon cancer Neg Hx    Rectal cancer Neg Hx    Stomach cancer Neg Hx     ALLERGIES:  has No Known Allergies.  MEDICATIONS:  Current Outpatient Medications  Medication Sig Dispense Refill   allopurinol (ZYLOPRIM) 300 MG tablet TAKE 1 TABLET(300 MG) BY MOUTH DAILY 90 tablet 0   amLODipine (NORVASC) 5 MG tablet TAKE 1 TABLET(5 MG) BY MOUTH DAILY 90 tablet 3   aspirin 81 MG tablet Take 81 mg by mouth daily.     hydroxyurea (HYDREA) 500 MG capsule Take 1 capsule (500 mg total) by mouth 3 (three) times  daily. May take with food to minimize GI side effects. 90 capsule 4   metoprolol succinate (TOPROL-XL) 100 MG 24 hr tablet TAKE 1 TABLET(100 MG) BY MOUTH DAILY WITH OR IMMEDIATELY FOLLOWING A MEAL 90 tablet 3   No current facility-administered medications for this visit.    PHYSICAL EXAMINATION: ECOG PERFORMANCE STATUS: 0 - Asymptomatic  Vitals:   03/03/22 1551  BP: (!) 160/108  Pulse: 90  Resp: 18  Temp: 98.1 F (36.7 C)  SpO2: 99%   Filed Weights   03/03/22 1551  Weight: 263 lb 8 oz (119.5 kg)   Physical Exam Constitutional:      Appearance: Normal appearance.  HENT:     Head: Normocephalic and atraumatic.  Cardiovascular:     Rate and Rhythm: Normal rate and regular rhythm.     Pulses: Normal pulses.     Heart sounds: Normal heart sounds.  Pulmonary:     Effort: Pulmonary effort is normal.     Breath sounds: Normal breath sounds.  Abdominal:     General: Abdomen is flat. Bowel sounds are normal.     Palpations: Abdomen is soft.  Musculoskeletal:        General: No swelling, tenderness or deformity.     Cervical back: Normal range of motion.  Skin:    General: Skin is warm and dry.  Neurological:     General: No focal deficit present.     Mental Status: He is alert.  Psychiatric:        Mood and Affect: Mood normal.        Behavior: Behavior normal.    LABORATORY DATA:  I have reviewed the data as listed Lab Results  Component Value Date   WBC 11.6 (H) 01/26/2022   HGB 15.2 01/26/2022   HCT 45.3 01/26/2022   MCV 102.7 (H) 01/26/2022   PLT 406.0 (H) 01/26/2022     Chemistry      Component Value Date/Time   NA 140 01/26/2022 1351   NA 139 06/14/2018 0000   K 4.1 01/26/2022 1351   CL 105 01/26/2022 1351   CO2 26 01/26/2022 1351   BUN 21 01/26/2022 1351   BUN 21 06/14/2018 0000   CREATININE 1.64 (H) 01/26/2022 1351   CREATININE 1.87 (H) 06/04/2021 1328   GLU 98 06/14/2018 0000      Component Value Date/Time   CALCIUM 9.4 01/26/2022 1351    ALKPHOS 65 01/26/2022 1351   AST 16 01/26/2022 1351   AST 17 06/04/2021 1328   ALT 11  01/26/2022 1351   ALT 11 06/04/2021 1328   BILITOT 0.9 01/26/2022 1351   BILITOT 1.4 (H) 06/04/2021 1328     Labs from April reviewed.  RADIOGRAPHIC STUDIES: I have personally reviewed the radiological images as listed and agreed with the findings in the report. I spent 20 minutes in the care of this patient including history, review of records, counseling and coordination of care All questions were answered. The patient knows to call the clinic with any problems, questions or concerns.    Benay Pike, MD 03/03/2022 3:55 PM

## 2022-03-25 ENCOUNTER — Other Ambulatory Visit: Payer: Self-pay | Admitting: Internal Medicine

## 2022-05-10 ENCOUNTER — Other Ambulatory Visit: Payer: Self-pay | Admitting: Hematology and Oncology

## 2022-05-31 ENCOUNTER — Other Ambulatory Visit: Payer: Self-pay | Admitting: *Deleted

## 2022-05-31 DIAGNOSIS — D75839 Thrombocytosis, unspecified: Secondary | ICD-10-CM

## 2022-06-01 ENCOUNTER — Inpatient Hospital Stay: Payer: Medicare HMO | Attending: Hematology and Oncology | Admitting: Hematology and Oncology

## 2022-06-01 ENCOUNTER — Other Ambulatory Visit: Payer: Self-pay

## 2022-06-01 ENCOUNTER — Encounter: Payer: Self-pay | Admitting: Hematology and Oncology

## 2022-06-01 ENCOUNTER — Inpatient Hospital Stay: Payer: Medicare HMO

## 2022-06-01 VITALS — BP 167/81 | HR 61 | Temp 98.1°F | Resp 16 | Ht 72.0 in | Wt 259.1 lb

## 2022-06-01 DIAGNOSIS — D473 Essential (hemorrhagic) thrombocythemia: Secondary | ICD-10-CM

## 2022-06-01 DIAGNOSIS — I10 Essential (primary) hypertension: Secondary | ICD-10-CM | POA: Insufficient documentation

## 2022-06-01 DIAGNOSIS — D539 Nutritional anemia, unspecified: Secondary | ICD-10-CM | POA: Insufficient documentation

## 2022-06-01 DIAGNOSIS — Z87891 Personal history of nicotine dependence: Secondary | ICD-10-CM | POA: Insufficient documentation

## 2022-06-01 DIAGNOSIS — D75839 Thrombocytosis, unspecified: Secondary | ICD-10-CM | POA: Insufficient documentation

## 2022-06-01 LAB — CBC WITH DIFFERENTIAL (CANCER CENTER ONLY)
Abs Immature Granulocytes: 0.03 10*3/uL (ref 0.00–0.07)
Basophils Absolute: 0.1 10*3/uL (ref 0.0–0.1)
Basophils Relative: 1 %
Eosinophils Absolute: 0.1 10*3/uL (ref 0.0–0.5)
Eosinophils Relative: 1 %
HCT: 35.1 % — ABNORMAL LOW (ref 39.0–52.0)
Hemoglobin: 12.7 g/dL — ABNORMAL LOW (ref 13.0–17.0)
Immature Granulocytes: 0 %
Lymphocytes Relative: 24 %
Lymphs Abs: 1.7 10*3/uL (ref 0.7–4.0)
MCH: 39.6 pg — ABNORMAL HIGH (ref 26.0–34.0)
MCHC: 36.2 g/dL — ABNORMAL HIGH (ref 30.0–36.0)
MCV: 109.3 fL — ABNORMAL HIGH (ref 80.0–100.0)
Monocytes Absolute: 0.8 10*3/uL (ref 0.1–1.0)
Monocytes Relative: 12 %
Neutro Abs: 4.3 10*3/uL (ref 1.7–7.7)
Neutrophils Relative %: 62 %
Platelet Count: 222 10*3/uL (ref 150–400)
RBC: 3.21 MIL/uL — ABNORMAL LOW (ref 4.22–5.81)
RDW: 13.8 % (ref 11.5–15.5)
WBC Count: 6.9 10*3/uL (ref 4.0–10.5)
nRBC: 0 % (ref 0.0–0.2)

## 2022-06-01 LAB — CMP (CANCER CENTER ONLY)
ALT: 12 U/L (ref 0–44)
AST: 17 U/L (ref 15–41)
Albumin: 4 g/dL (ref 3.5–5.0)
Alkaline Phosphatase: 61 U/L (ref 38–126)
Anion gap: 3 — ABNORMAL LOW (ref 5–15)
BUN: 18 mg/dL (ref 8–23)
CO2: 25 mmol/L (ref 22–32)
Calcium: 9.2 mg/dL (ref 8.9–10.3)
Chloride: 110 mmol/L (ref 98–111)
Creatinine: 1.53 mg/dL — ABNORMAL HIGH (ref 0.61–1.24)
GFR, Estimated: 50 mL/min — ABNORMAL LOW (ref >60)
Glucose, Bld: 109 mg/dL — ABNORMAL HIGH (ref 70–99)
Potassium: 4.3 mmol/L (ref 3.5–5.1)
Sodium: 138 mmol/L (ref 135–145)
Total Bilirubin: 1 mg/dL (ref 0.3–1.2)
Total Protein: 6.9 g/dL (ref 6.5–8.1)

## 2022-06-01 NOTE — Progress Notes (Signed)
[name] FOLLOW UP NOTE  Patient Care Team: [name], MD as PCP - General (Internal Medicine)  CHIEF COMPLAINTS/PURPOSE OF CONSULTATION:  ET follow up after hydrea dose modification  ASSESSMENT & PLAN:   This is a very pleasant 66 year old male patient with past medical history significant for hypertension referred to hematology for evaluation of persistent and extreme thrombocytosis, CALR mutated. He is currently on aspirin and Hydrea. He is taking hydrea TID. He is tolerating it well. Physical examination,  From ET standpoint, he will continue Hydrea and aspirin as recommended and return to clinic in 3 to 4 months.  HISTORY OF PRESENTING ILLNESS:   Howard Green 66 y.o. male is here because of ET   Oncology History   No history exists.   INTERIM HISTORY  Patient is here for a follow-up.  He is taking Hydrea TID and aspirin. He had not had any recent gout flares. He is very physically active.  No change in breathing, bowel or urinary habits HTN is not as well controlled, has PCP appointment with his PCP in October. Rest of the pertinent 10 point ROS reviewed and neg.  MEDICAL HISTORY:  Past Medical History:  Diagnosis Date   Allergy    seasonal like hayfever    Bleeding of eye, bilateral    Hypertension    Thyroid disease    benign cyst removed 15 years ago    SURGICAL HISTORY: Past Surgical History:  Procedure Laterality Date   THYROID SURGERY      SOCIAL HISTORY: Social History   Socioeconomic History   Marital status: Widowed    Spouse name: Not on file   Number of children: Not on file   Years of education: Not on file   Highest education level: Not on file  Occupational History   Not on file  Tobacco Use   Smoking status: Never   Smokeless tobacco: Never  Vaping Use   Vaping Use: Former  Substance and Sexual Activity   Alcohol use: No    Alcohol/week: 0.0 standard drinks of alcohol   Drug use: No   Sexual  activity: Not on file  Other Topics Concern   Not on file  Social History Narrative   Lives with wife in a one story home.  Has 4 children.     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      Social Determinants of Health   Financial Resource Strain: Not on file  Food Insecurity: Not on file  Transportation Needs: Not on file  Physical Activity: Not on file  Stress: Not on file  Social Connections: Not on file  Intimate Partner Violence: Not on file    FAMILY HISTORY: Family History  Problem Relation Age of Onset   Diabetes Mother    Other Mother        Good Pastures disease   Lung cancer Father        passed 75   Healthy Brother    Healthy Sister    Healthy Son    Healthy Daughter    Colon cancer Neg Hx    Rectal cancer Neg Hx    Stomach cancer Neg Hx     ALLERGIES:  has No Known Allergies.  MEDICATIONS:  Current Outpatient Medications  Medication Sig Dispense Refill   allopurinol (ZYLOPRIM) 300 MG tablet TAKE 1 TABLET(300 MG) BY MOUTH DAILY 90 tablet 0   amLODipine (NORVASC) 5 MG tablet TAKE 1 TABLET(5 MG) BY MOUTH DAILY  90 tablet 3   aspirin 81 MG tablet Take 81 mg by mouth daily.     hydroxyurea (HYDREA) 500 MG capsule TAKE 1 CAPSULE(500 MG) BY MOUTH THREE TIMES DAILY. MAY TAKE WITH FOOD TO MINIMIZE GI SIDE EFFECTS 90 capsule 4   metoprolol succinate (TOPROL-XL) 100 MG 24 hr tablet TAKE 1 TABLET(100 MG) BY MOUTH DAILY WITH OR IMMEDIATELY FOLLOWING A MEAL 90 tablet 3   No current facility-administered medications for this visit.    PHYSICAL EXAMINATION: ECOG PERFORMANCE STATUS: 0 - Asymptomatic  Vitals:   06/01/22 1308  BP: (!) 167/81  Pulse: 61  Resp: 16  Temp: 98.1 F (36.7 C)  SpO2: 100%   Filed Weights   06/01/22 1308  Weight: 259 lb 1.6 oz (117.5 kg)   Physical Exam Constitutional:      Appearance: Normal appearance.  HENT:     Head: Normocephalic and atraumatic.  Cardiovascular:     Rate and Rhythm: Normal rate and regular rhythm.      Pulses: Normal pulses.     Heart sounds: Normal heart sounds.  Pulmonary:     Effort: Pulmonary effort is normal.     Breath sounds: Normal breath sounds.  Abdominal:     General: Abdomen is flat. Bowel sounds are normal.     Palpations: Abdomen is soft.  Musculoskeletal:        General: No swelling, tenderness or deformity.     Cervical back: Normal range of motion.  Skin:    General: Skin is warm and dry.  Neurological:     General: No focal deficit present.     Mental Status: He is alert.  Psychiatric:        Mood and Affect: Mood normal.        Behavior: Behavior normal.     LABORATORY DATA:  I have reviewed the data as listed Lab Results  Component Value Date   WBC 6.9 06/01/2022   HGB 12.7 (L) 06/01/2022   HCT 35.1 (L) 06/01/2022   MCV 109.3 (H) 06/01/2022   PLT 222 06/01/2022     Chemistry      Component Value Date/Time   NA 140 01/26/2022 1351   NA 139 06/14/2018 0000   K 4.1 01/26/2022 1351   CL 105 01/26/2022 1351   CO2 26 01/26/2022 1351   BUN 21 01/26/2022 1351   BUN 21 06/14/2018 0000   CREATININE 1.64 (H) 01/26/2022 1351   CREATININE 1.87 (H) 06/04/2021 1328   GLU 98 06/14/2018 0000      Component Value Date/Time   CALCIUM 9.4 01/26/2022 1351   ALKPHOS 65 01/26/2022 1351   AST 16 01/26/2022 1351   AST 17 06/04/2021 1328   ALT 11 01/26/2022 1351   ALT 11 06/04/2021 1328   BILITOT 0.9 01/26/2022 1351   BILITOT 1.4 (H) 06/04/2021 1328     Labs from April reviewed.  RADIOGRAPHIC STUDIES: I have personally reviewed the radiological images as listed and agreed with the findings in the report. I spent 20 minutes in the care of this patient including history, review of records, counseling and coordination of care All questions were answered. The patient knows to call the clinic with any problems, questions or concerns.    Benay Pike, MD 06/01/2022 1:19 PM

## 2022-06-01 NOTE — Progress Notes (Signed)
[facility] FOLLOW UP NOTE  Patient Care Team: [name], MD as PCP - General (Internal Medicine)  CHIEF COMPLAINTS/PURPOSE OF CONSULTATION:  ET follow up after hydrea dose modification  ASSESSMENT & PLAN:   This is a very pleasant 66 year old male patient with past medical history significant for hypertension referred to hematology for evaluation of persistent and extreme thrombocytosis, CALR mutated. He is currently on aspirin and Hydrea, tolerating it very well.  Most recent labs show improvement in thrombocytosis.  Mild anemia with macrocytosis likely from Hydrea use.  Physical examination today unremarkable.  We will continue current dose along with aspirin and he will return to clinic in 3 to 4 months with repeat labs. He will follow-up with his PCP Dr. Sharlet Salina for management of hypertension. Thank you for consulting [place] in the care of this patient.  Please do not hesitate to contact us with any additional questions or concerns.  HISTORY OF PRESENTING ILLNESS:   Howard Green 66 y.o. male is here because of ET   Oncology History   No history exists.   INTERIM HISTORY  Patient is here for a follow-up.  He is now taking Hydrea 3 times a day along with aspirin as recommended.  He is tolerating this well.  He denies any mouth sores, skin rash or lower extremity swelling although he has noticed some sensitivity to spicy food.  His high blood pressure is also not as well controlled, has a PCP appointment coming up in October. He had not had any recent gout flares. He is very physically active.  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  No change in breathing, bowel or urinary habits.  No hematochezia or melena. Rest of the pertinent 10 point ROS reviewed and neg.  MEDICAL HISTORY:  Past Medical History:  Diagnosis Date   Allergy    seasonal like hayfever    Bleeding of eye, bilateral    Hypertension    Thyroid disease    benign cyst removed  15 years ago    SURGICAL HISTORY: Past Surgical History:  Procedure Laterality Date   THYROID SURGERY      SOCIAL HISTORY: Social History   Socioeconomic History   Marital status: Widowed    Spouse name: Not on file   Number of children: Not on file   Years of education: Not on file   Highest education level: Not on file  Occupational History   Not on file  Tobacco Use   Smoking status: Never   Smokeless tobacco: Never  Vaping Use   Vaping Use: Former  Substance and Sexual Activity   Alcohol use: No    Alcohol/week: 0.0 standard drinks of alcohol   Drug use: No   Sexual activity: Not on file  Other Topics Concern   Not on file  Social History Narrative   Lives with wife in a one story home.  Has 4 children.     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      Social Determinants of Health   Financial Resource Strain: Not on file  Food Insecurity: Not on file  Transportation Needs: Not on file  Physical Activity: Not on file  Stress: Not on file  Social Connections: Not on file  Intimate Partner Violence: Not on file    FAMILY HISTORY: Family History  Problem Relation Age of Onset   Diabetes Mother    Other Mother        Good  Pastures disease   Lung cancer Father        passed 36   Healthy Brother    Healthy Sister    Healthy Son    Healthy Daughter    Colon cancer Neg Hx    Rectal cancer Neg Hx    Stomach cancer Neg Hx     ALLERGIES:  has No Known Allergies.  MEDICATIONS:  Current Outpatient Medications  Medication Sig Dispense Refill   allopurinol (ZYLOPRIM) 300 MG tablet TAKE 1 TABLET(300 MG) BY MOUTH DAILY 90 tablet 0   amLODipine (NORVASC) 5 MG tablet TAKE 1 TABLET(5 MG) BY MOUTH DAILY 90 tablet 3   aspirin 81 MG tablet Take 81 mg by mouth daily.     hydroxyurea (HYDREA) 500 MG capsule TAKE 1 CAPSULE(500 MG) BY MOUTH THREE TIMES DAILY. MAY TAKE WITH FOOD TO MINIMIZE GI SIDE EFFECTS 90 capsule 4   metoprolol succinate (TOPROL-XL) 100  MG 24 hr tablet TAKE 1 TABLET(100 MG) BY MOUTH DAILY WITH OR IMMEDIATELY FOLLOWING A MEAL 90 tablet 3   No current facility-administered medications for this visit.    PHYSICAL EXAMINATION: ECOG PERFORMANCE STATUS: 0 - Asymptomatic  Vitals:   06/01/22 1308  BP: (!) 167/81  Pulse: 61  Resp: 16  Temp: 98.1 F (36.7 C)  SpO2: 100%   Filed Weights   06/01/22 1308  Weight: 259 lb 1.6 oz (117.5 kg)   Physical Exam Constitutional:      Appearance: Normal appearance.  HENT:     Head: Normocephalic and atraumatic.  Cardiovascular:     Rate and Rhythm: Normal rate and regular rhythm.     Pulses: Normal pulses.     Heart sounds: Normal heart sounds.  Pulmonary:     Effort: Pulmonary effort is normal.     Breath sounds: Normal breath sounds.  Abdominal:     General: Abdomen is flat. Bowel sounds are normal.     Palpations: Abdomen is soft.  Musculoskeletal:        General: No swelling, tenderness or deformity.     Cervical back: Normal range of motion.  Skin:    General: Skin is warm and dry.  Neurological:     General: No focal deficit present.     Mental Status: He is alert.  Psychiatric:        Mood and Affect: Mood normal.        Behavior: Behavior normal.     LABORATORY DATA:  I have reviewed the data as listed Lab Results  Component Value Date   WBC 6.9 06/01/2022   HGB 12.7 (L) 06/01/2022   HCT 35.1 (L) 06/01/2022   MCV 109.3 (H) 06/01/2022   PLT 222 06/01/2022     Chemistry      Component Value Date/Time   NA 140 01/26/2022 1351   NA 139 06/14/2018 0000   K 4.1 01/26/2022 1351   CL 105 01/26/2022 1351   CO2 26 01/26/2022 1351   BUN 21 01/26/2022 1351   BUN 21 06/14/2018 0000   CREATININE 1.64 (H) 01/26/2022 1351   CREATININE 1.87 (H) 06/04/2021 1328   GLU 98 06/14/2018 0000      Component Value Date/Time   CALCIUM 9.4 01/26/2022 1351   ALKPHOS 65 01/26/2022 1351   AST 16 01/26/2022 1351   AST 17 06/04/2021 1328   ALT 11 01/26/2022 1351   ALT  11 06/04/2021 1328   BILITOT 0.9 01/26/2022 1351   BILITOT 1.4 (H) 06/04/2021 1328  CBC from today reviewed.  RADIOGRAPHIC STUDIES: I have personally reviewed the radiological images as listed and agreed with the findings in the report. I spent 20 minutes in the care of this patient including history, review of records, counseling and coordination of care All questions were answered. The patient knows to call the clinic with any problems, questions or concerns.    Benay Pike, MD 06/01/2022 1:18 PM

## 2022-06-30 ENCOUNTER — Other Ambulatory Visit: Payer: Self-pay | Admitting: Internal Medicine

## 2022-06-30 DIAGNOSIS — H52209 Unspecified astigmatism, unspecified eye: Secondary | ICD-10-CM | POA: Diagnosis not present

## 2022-06-30 DIAGNOSIS — H5213 Myopia, bilateral: Secondary | ICD-10-CM | POA: Diagnosis not present

## 2022-06-30 DIAGNOSIS — H524 Presbyopia: Secondary | ICD-10-CM | POA: Diagnosis not present

## 2022-07-09 ENCOUNTER — Ambulatory Visit (INDEPENDENT_AMBULATORY_CARE_PROVIDER_SITE_OTHER): Payer: Medicare HMO

## 2022-07-09 VITALS — Ht 72.0 in | Wt 259.0 lb

## 2022-07-09 DIAGNOSIS — Z0000 Encounter for general adult medical examination without abnormal findings: Secondary | ICD-10-CM | POA: Diagnosis not present

## 2022-07-09 NOTE — Patient Instructions (Signed)
Howard Green , Thank you for taking time to come for your Medicare Wellness Visit. I appreciate your ongoing commitment to your health goals. Please review the following plan we discussed and let me know if I can assist you in the future.   These are the goals we discussed:  Goals      To maintain my current health status by continuing to eat healthy, stay physically active and socially active.        This is a list of the screening recommended for you and due dates:  Health Maintenance  Topic Date Due   Hepatitis C Screening: USPSTF Recommendation to screen - Ages 101-79 yo.  Never done   Zoster (Shingles) Vaccine (1 of 2) Never done   Colon Cancer Screening  Never done   COVID-19 Vaccine (3 - Pfizer risk series) 02/05/2020   Flu Shot  Never done   Pneumonia Vaccine (1 - PCV) 01/27/2023*   Tetanus Vaccine  03/20/2025   HPV Vaccine  Aged Out  *Topic was postponed. The date shown is not the original due date.    Advanced directives: No  Conditions/risks identified: Yes  Next appointment: Follow up in one year for your annual wellness visit.   Preventive Care 36 Years and Older, Male  Preventive care refers to lifestyle choices and visits with your health care provider that can promote health and wellness. What does preventive care include? A yearly physical exam. This is also called an annual well check. Dental exams once or twice a year. Routine eye exams. Ask your health care provider how often you should have your eyes checked. Personal lifestyle choices, including: Daily care of your teeth and gums. Regular physical activity. Eating a healthy diet. Avoiding tobacco and drug use. Limiting alcohol use. Practicing safe sex. Taking low doses of aspirin every day. Taking vitamin and mineral supplements as recommended by your health care provider. What happens during an annual well check? The services and screenings done by your health care provider during your annual  well check will depend on your age, overall health, lifestyle risk factors, and family history of disease. Counseling  Your health care provider may ask you questions about your: Alcohol use. Tobacco use. Drug use. Emotional well-being. Home and relationship well-being. Sexual activity. Eating habits. History of falls. Memory and ability to understand (cognition). Work and work Statistician. Screening  You may have the following tests or measurements: Height, weight, and BMI. Blood pressure. Lipid and cholesterol levels. These may be checked every 5 years, or more frequently if you are over 81 years old. Skin check. Lung cancer screening. You may have this screening every year starting at age 27 if you have a 30-pack-year history of smoking and currently smoke or have quit within the past 15 years. Fecal occult blood test (FOBT) of the stool. You may have this test every year starting at age 24. Flexible sigmoidoscopy or colonoscopy. You may have a sigmoidoscopy every 5 years or a colonoscopy every 10 years starting at age 38. Prostate cancer screening. Recommendations will vary depending on your family history and other risks. Hepatitis C blood test. Hepatitis B blood test. Sexually transmitted disease (STD) testing. Diabetes screening. This is done by checking your blood sugar (glucose) after you have not eaten for a while (fasting). You may have this done every 1-3 years. Abdominal aortic aneurysm (AAA) screening. You may need this if you are a current or former smoker. Osteoporosis. You may be screened starting at age 9  if you are at high risk. Talk with your health care provider about your test results, treatment options, and if necessary, the need for more tests. Vaccines  Your health care provider may recommend certain vaccines, such as: Influenza vaccine. This is recommended every year. Tetanus, diphtheria, and acellular pertussis (Tdap, Td) vaccine. You may need a Td booster  every 10 years. Zoster vaccine. You may need this after age 26. Pneumococcal 13-valent conjugate (PCV13) vaccine. One dose is recommended after age 27. Pneumococcal polysaccharide (PPSV23) vaccine. One dose is recommended after age 14. Talk to your health care provider about which screenings and vaccines you need and how often you need them. This information is not intended to replace advice given to you by your health care provider. Make sure you discuss any questions you have with your health care provider. Document Released: 10/17/2015 Document Revised: 06/09/2016 Document Reviewed: 07/22/2015 Elsevier Interactive Patient Education  2017 [place] Prevention in the Home Falls can cause injuries. They can happen to people of all ages. There are many things you can do to make your home safe and to help prevent falls. What can I do on the outside of my home? Regularly fix the edges of walkways and driveways and fix any cracks. Remove anything that might make you trip as you walk through a door, such as a raised step or threshold. Trim any bushes or trees on the path to your home. Use bright outdoor lighting. Clear any walking paths of anything that might make someone trip, such as rocks or tools. Regularly check to see if handrails are loose or broken. Make sure that both sides of any steps have handrails. Any raised decks and porches should have guardrails on the edges. Have any leaves, snow, or ice cleared regularly. Use sand or salt on walking paths during winter. Clean up any spills in your garage right away. This includes oil or grease spills. What can I do in the bathroom? Use night lights. Install grab bars by the toilet and in the tub and shower. Do not use towel bars as grab bars. Use non-skid mats or decals in the tub or shower. If you need to sit down in the shower, use a plastic, non-slip stool. Keep the floor dry. Clean up any water that spills on the floor as soon  as it happens. Remove soap buildup in the tub or shower regularly. Attach bath mats securely with double-sided non-slip rug tape. Do not have throw rugs and other things on the floor that can make you trip. What can I do in the bedroom? Use night lights. Make sure that you have a light by your bed that is easy to reach. Do not use any sheets or blankets that are too big for your bed. They should not hang down onto the floor. Have a firm chair that has side arms. You can use this for support while you get dressed. Do not have throw rugs and other things on the floor that can make you trip. What can I do in the kitchen? Clean up any spills right away. Avoid walking on wet floors. Keep items that you use a lot in easy-to-reach places. If you need to reach something above you, use a strong step stool that has a grab bar. Keep electrical cords out of the way. Do not use floor polish or wax that makes floors slippery. If you must use wax, use non-skid floor wax. Do not have throw rugs and other things  on the floor that can make you trip. What can I do with my stairs? Do not leave any items on the stairs. Make sure that there are handrails on both sides of the stairs and use them. Fix handrails that are broken or loose. Make sure that handrails are as long as the stairways. Check any carpeting to make sure that it is firmly attached to the stairs. Fix any carpet that is loose or worn. Avoid having throw rugs at the top or bottom of the stairs. If you do have throw rugs, attach them to the floor with carpet tape. Make sure that you have a light switch at the top of the stairs and the bottom of the stairs. If you do not have them, ask someone to add them for you. What else can I do to help prevent falls? Wear shoes that: Do not have high heels. Have rubber bottoms. Are comfortable and fit you well. Are closed at the toe. Do not wear sandals. If you use a stepladder: Make sure that it is fully  opened. Do not climb a closed stepladder. Make sure that both sides of the stepladder are locked into place. Ask someone to hold it for you, if possible. Clearly mark and make sure that you can see: Any grab bars or handrails. First and last steps. Where the edge of each step is. Use tools that help you move around (mobility aids) if they are needed. These include: Canes. Walkers. Scooters. Crutches. Turn on the lights when you go into a dark area. Replace any light bulbs as soon as they burn out. Set up your furniture so you have a clear path. Avoid moving your furniture around. If any of your floors are uneven, fix them. If there are any pets around you, be aware of where they are. Review your medicines with your doctor. Some medicines can make you feel dizzy. This can increase your chance of falling. Ask your doctor what other things that you can do to help prevent falls. This information is not intended to replace advice given to you by your health care provider. Make sure you discuss any questions you have with your health care provider. Document Released: 07/17/2009 Document Revised: 02/26/2016 Document Reviewed: 10/25/2014 Elsevier Interactive Patient Education  2017 [employer].

## 2022-07-09 NOTE — Progress Notes (Addendum)
Virtual Visit via Telephone Note  I connected with  Howard Green on 07/09/22 at  9:45 AM EDT by telephone and verified that I am speaking with the correct person using two identifiers.  Location: Patient: Home Provider: [place] Persons participating in the virtual visit: [place]   I discussed the limitations, risks, security and privacy concerns of performing an evaluation and management service by telephone and the availability of in person appointments. The patient expressed understanding and agreed to proceed.  Interactive audio and video telecommunications were attempted between this nurse and patient, however failed, due to patient having technical difficulties OR patient did not have access to video capability.  We continued and completed visit with audio only.  Some vital signs may be absent or patient reported.   Sheral Flow, LPN  Subjective:   Howard Green is a 66 y.o. male who presents for Medicare Annual/Subsequent preventive examination.  Review of Systems     Cardiac Risk Factors include: advanced age (>31mn, >>58women);hypertension;male gender;obesity (BMI >30kg/m2)     Objective:    Today's Vitals   07/09/22 1007  Weight: 259 lb (117.5 kg)  Height: 6' (1.829 m)  PainSc: 0-No pain   Body mass index is 35.13 kg/m.     07/09/2022    9:50 AM 10/29/2020   12:38 PM 10/15/2020    3:07 PM 09/02/2019    8:54 AM 07/21/2018    4:28 PM 03/02/2016    8:06 PM 12/23/2014    8:03 AM  Advanced Directives  Does Patient Have a Medical Advance Directive? No No No No No No No  Would patient like information on creating a medical advance directive? No - Patient declined No - Patient declined No - Patient declined  No - Patient declined No - patient declined information     Current Medications (verified) Outpatient Encounter Medications as of 07/09/2022  Medication Sig   allopurinol (ZYLOPRIM) 300 MG tablet TAKE 1 TABLET(300  MG) BY MOUTH DAILY   amLODipine (NORVASC) 5 MG tablet TAKE 1 TABLET(5 MG) BY MOUTH DAILY   aspirin 81 MG tablet Take 81 mg by mouth daily.   hydroxyurea (HYDREA) 500 MG capsule TAKE 1 CAPSULE(500 MG) BY MOUTH THREE TIMES DAILY. MAY TAKE WITH FOOD TO MINIMIZE GI SIDE EFFECTS   metoprolol succinate (TOPROL-XL) 100 MG 24 hr tablet TAKE 1 TABLET(100 MG) BY MOUTH DAILY WITH OR IMMEDIATELY FOLLOWING A MEAL   No facility-administered encounter medications on file as of 07/09/2022.    Allergies (verified) Patient has no known allergies.   History: Past Medical History:  Diagnosis Date   Allergy    seasonal like hayfever    Bleeding of eye, bilateral    Hypertension    Thyroid disease    benign cyst removed 15 years ago   Past Surgical History:  Procedure Laterality Date   THYROID SURGERY     Family History  Problem Relation Age of Onset   Diabetes Mother    Other Mother        Good Pastures disease   Lung cancer Father        passed 132  Healthy Brother    Healthy Sister    Healthy Son    Healthy Daughter    Colon cancer Neg Hx    Rectal cancer Neg Hx    Stomach cancer Neg Hx    Social History   Socioeconomic History   Marital status: Widowed    Spouse name: Not on file  Number of children: Not on file   Years of education: Not on file   Highest education level: Not on file  Occupational History   Not on file  Tobacco Use   Smoking status: Never   Smokeless tobacco: Never  Vaping Use   Vaping Use: Former  Substance and Sexual Activity   Alcohol use: No    Alcohol/week: 0.0 standard drinks of alcohol   Drug use: No   Sexual activity: Not on file  Other Topics Concern   Not on file  Social History Narrative   Lives with wife in a one story home.  Has 4 children.     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      Social Determinants of Health   Financial Resource Strain: Low Risk  (07/09/2022)   Overall Financial Resource Strain (CARDIA)     Difficulty of Paying Living Expenses: Not hard at all  Food Insecurity: No Food Insecurity (07/09/2022)   Hunger Vital Sign    Worried About Running Out of Food in the Last Year: Never true    Ran Out of Food in the Last Year: Never true  Transportation Needs: No Transportation Needs (07/09/2022)   PRAPARE - Hydrologist (Medical): No    Lack of Transportation (Non-Medical): No  Physical Activity: Sufficiently Active (07/09/2022)   Exercise Vital Sign    Days of Exercise per Week: 7 days    Minutes of Exercise per Session: 60 min  Stress: No Stress Concern Present (07/09/2022)   [place]    Feeling of Stress : Not at all  Social Connections: Moderately Integrated (07/09/2022)   Social Connection and Isolation Panel [NHANES]    Frequency of Communication with Friends and Family: More than three times a week    Frequency of Social Gatherings with Friends and Family: More than three times a week    Attends Religious Services: More than 4 times per year    Active Member of [employer] or Organizations: Yes    Attends Archivist Meetings: More than 4 times per year    Marital Status: Widowed    Tobacco Counseling Counseling given: Not Answered   Clinical Intake:  Pre-visit preparation completed: Yes  Pain : No/denies pain Pain Score: 0-No pain     BMI - recorded: 35.13 Nutritional Status: BMI > 30  Obese Nutritional Risks: None Diabetes: No  How often do you need to have someone help you when you read instructions, pamphlets, or other written materials from your doctor or pharmacy?: 1 - Never What is the last grade level you completed in school?: HSG; some college courses  Diabetic? no  Interpreter Needed?: No  Information entered by :: Lisette Abu, LPN.   Activities of Daily Living    07/09/2022    9:58 AM  In your present state of health, do you have any difficulty  performing the following activities:  Hearing? 0  Vision? 0  Difficulty concentrating or making decisions? 0  Walking or climbing stairs? 0  Dressing or bathing? 0  Doing errands, shopping? 0  Preparing Food and eating ? N  Using the Toilet? N  In the past six months, have you accidently leaked urine? N  Do you have problems with loss of bowel control? N  Managing your Medications? N  Managing your Finances? N  Housekeeping or managing your Housekeeping? N    Patient Care Team: Sharlet Salina,  Real Cons, MD as PCP - General (Internal Medicine)  Indicate any recent Medical Services you may have received from other than [facility] providers in the past year (date may be approximate).     Assessment:   This is a routine wellness examination for [place].  Hearing/Vision screen Hearing Screening - Comments:: Denies hearing difficulties.  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        Depression Screen    07/09/2022    9:58 AM 01/26/2022    1:37 PM 09/19/2020    2:16 PM 09/03/2019    2:57 PM 03/03/2017   10:34 AM  PHQ 2/9 Scores  PHQ - 2 Score 0 0 0 0 0  PHQ- 9 Score  0 0  0    Fall Risk    07/09/2022    9:50 AM 01/26/2022    1:37 PM 09/19/2020    2:16 PM  [place] in the past year? 0 0 0  Number falls in past yr: 0 0 0  Injury with Fall? 0 0 0  Risk for fall due to : No Fall Risks    Follow up Falls prevention discussed       FALL RISK PREVENTION PERTAINING TO THE HOME:  Any stairs in or around the home? No  If so, are there any without handrails? No  Home free of loose throw rugs in walkways, pet beds, electrical cords, etc? Yes  Adequate lighting in your home to reduce risk of falls? Yes   ASSISTIVE DEVICES UTILIZED TO PREVENT FALLS:  Life alert? No  Use of a cane, walker or w/c? No  Grab bars in the bathroom? No  Shower chair or bench in shower? No  Elevated toilet seat or a handicapped toilet? No   TIMED UP AND GO:  Was the test performed? No . Phone Visit   Cognitive Function:        07/09/2022    9:59 AM  6CIT Screen  What Year? 0 points  What month? 0 points  What time? 0 points  Count back from 20 0 points  Months in reverse 0 points  Repeat phrase 0 points  Total Score 0 points    Immunizations Immunization History  Administered Date(s) Administered   PFIZER(Purple Top)SARS-COV-2 Vaccination 12/18/2019, 01/08/2020   Tdap 03/21/2015    TDAP status: Up to date  Flu Vaccine status: Declined, Education has been provided regarding the importance of this vaccine but patient still declined. Advised may receive this vaccine at local pharmacy or Health Dept. Aware to provide a copy of the vaccination record if obtained from local pharmacy or Health Dept. Verbalized acceptance and understanding.  Pneumococcal vaccine status: Declined,  Education has been provided regarding the importance of this vaccine but patient still declined. Advised may receive this vaccine at local pharmacy or Health Dept. Aware to provide a copy of the vaccination record if obtained from local pharmacy or Health Dept. Verbalized acceptance and understanding.  Covid-19 vaccine status: Completed vaccines  Qualifies for Shingles Vaccine? Yes   Zostavax completed No   Shingrix Completed?: No.    Education has been provided regarding the importance of this vaccine. Patient has been advised to call insurance  company to determine out of pocket expense if they have not yet received this vaccine. Advised may also receive vaccine at local pharmacy or Health Dept. Verbalized acceptance and understanding.  Screening Tests Health Maintenance  Topic Date Due   Hepatitis C Screening  Never done   Zoster Vaccines- Shingrix (1 of 2) Never done   COLONOSCOPY (Pts 45-37yr Insurance coverage will need to be confirmed)  Never done   COVID-19 Vaccine (3 - Pfizer risk series) 02/05/2020   INFLUENZA VACCINE  Never done   Pneumonia Vaccine 67 Years old (1 - PCV) 01/27/2023 (Originally 04/26/2020)   TETANUS/TDAP  03/20/2025   HPV VACCINES  Aged Out    Health Maintenance  Health Maintenance Due  Topic Date Due   Hepatitis C Screening  Never done   Zoster Vaccines- Shingrix (1 of 2) Never done   COLONOSCOPY (Pts 45-449yrInsurance coverage will need to be confirmed)  Never done   COVID-19 Vaccine (3 - Pfizer risk series) 02/05/2020   INFLUENZA VACCINE  Never done    Colorectal screening: declined  Lung Cancer Screening: (Low Dose CT Chest recommended if Age 67-80ears, 30 pack-year currently smoking OR have quit w/in 15years.) does not qualify.   Lung Cancer Screening Referral: no  Additional Screening:  Hepatitis C Screening: does qualify; Completed no  Vision Screening: Recommended annual ophthalmology exams for early detection of glaucoma and other disorders of the eye. Is the patient up to date with their annual eye exam?  Yes  Who is the provider or what is the name of the office in which the patient attends annual eye exams? [facility] If pt is not established with a provider, would they like to be referred to a provider to establish care? No .   Dental Screening: Recommended annual dental exams for proper oral hygiene  Community Resource Referral / Chronic Care Management: CRR required this visit?  No   CCM required this visit?  No      Plan:     I have personally reviewed  and noted the following in the patient's chart:   Medical and social history Use of alcohol, tobacco or illicit drugs  Current medications and supplements including opioid prescriptions. Patient is not currently taking opioid prescriptions. Functional ability and status Nutritional status Physical activity Advanced directives List of other physicians Hospitalizations, surgeries, and ER visits in previous 12 months Vitals Screenings to include cognitive, depression, and falls Referrals and appointments  In addition, I have reviewed and discussed with patient certain preventive protocols, quality metrics, and best practice recommendations. A written personalized care plan for preventive services as well as general preventive health recommendations were provided to patient.     ShSheral FlowLPN   [id number] Nurse Notes: N/A

## 2022-07-26 ENCOUNTER — Ambulatory Visit: Payer: Medicare HMO | Admitting: Internal Medicine

## 2022-07-27 ENCOUNTER — Ambulatory Visit: Payer: Medicare HMO | Admitting: Internal Medicine

## 2022-08-04 ENCOUNTER — Encounter: Payer: Self-pay | Admitting: Internal Medicine

## 2022-08-04 ENCOUNTER — Ambulatory Visit (INDEPENDENT_AMBULATORY_CARE_PROVIDER_SITE_OTHER): Payer: Medicare HMO | Admitting: Internal Medicine

## 2022-08-04 VITALS — BP 156/80 | HR 108 | Temp 98.0°F | Ht 72.0 in | Wt 263.0 lb

## 2022-08-04 DIAGNOSIS — N1831 Chronic kidney disease, stage 3a: Secondary | ICD-10-CM | POA: Diagnosis not present

## 2022-08-04 DIAGNOSIS — Z0000 Encounter for general adult medical examination without abnormal findings: Secondary | ICD-10-CM | POA: Diagnosis not present

## 2022-08-04 DIAGNOSIS — L989 Disorder of the skin and subcutaneous tissue, unspecified: Secondary | ICD-10-CM | POA: Diagnosis not present

## 2022-08-04 DIAGNOSIS — I4819 Other persistent atrial fibrillation: Secondary | ICD-10-CM

## 2022-08-04 MED ORDER — FLUOROURACIL 5 % EX CREA: TOPICAL_CREAM | Freq: Two times a day (BID) | CUTANEOUS | 0 refills | Status: AC

## 2022-08-04 NOTE — Assessment & Plan Note (Signed)
Most recent GFR 50 which is improved. He is now taking medications more regularly due to having coverage.

## 2022-08-04 NOTE — Assessment & Plan Note (Signed)
Flu shot declines. Covid-19 counseled. Pneumonia declines. Shingrix declines. Tetanus up to date. Colonoscopy declines. Counseled about sun safety and mole surveillance. Counseled about the dangers of distracted driving. Given 10 year screening recommendations.

## 2022-08-04 NOTE — Assessment & Plan Note (Signed)
Rate controlled and taking aspirin 81 mg daily. On metoprolol 100 mg daily.

## 2022-08-04 NOTE — Assessment & Plan Note (Signed)
Scalp with multiple lesions and will treat with 5% Fluorouracil cream to scalp BID for 2 weeks.

## 2022-08-04 NOTE — Patient Instructions (Signed)
We have sent in the cream to use on the scalp twice a day for 2 weeks. This will cause a chemical burn of the scalp to remove pre-cancerous spot.

## 2022-08-04 NOTE — Progress Notes (Signed)
   Subjective:   Patient ID: Howard Green, male    DOB: 07-12-1956, 66 y.o.   MRN: [id number]  HPI The patient is here for physical.  PMH, [facility], social history reviewed and updated  Review of Systems  Constitutional: Negative.   HENT: Negative.    Eyes: Negative.   Respiratory:  Negative for cough, chest tightness and shortness of breath.   Cardiovascular:  Negative for chest pain, palpitations and leg swelling.  Gastrointestinal:  Negative for abdominal distention, abdominal pain, constipation, diarrhea, nausea and vomiting.  Musculoskeletal: Negative.   Skin: Negative.   Neurological: Negative.   Psychiatric/Behavioral: Negative.      Objective:  Physical Exam Constitutional:      Appearance: He is well-developed.  HENT:     Head: Normocephalic and atraumatic.  Cardiovascular:     Rate and Rhythm: Normal rate and regular rhythm.  Pulmonary:     Effort: Pulmonary effort is normal. No respiratory distress.     Breath sounds: Normal breath sounds. No wheezing or rales.  Abdominal:     General: Bowel sounds are normal. There is no distension.     Palpations: Abdomen is soft.     Tenderness: There is no abdominal tenderness. There is no rebound.  Musculoskeletal:     Cervical back: Normal range of motion.  Skin:    General: Skin is warm and dry.     Comments: Scalp with precancerous lesions  Neurological:     Mental Status: He is alert and oriented to person, place, and time.     Coordination: Coordination normal.     Vitals:   08/04/22 1045 08/04/22 1048  BP: (!) 162/80 (!) 156/80  Pulse: (!) 108   Temp: 98 F (36.7 C)   SpO2: 97%   Weight: 263 lb (119.3 kg)   Height: 6' (1.829 m)     Assessment & Plan:

## 2022-09-27 ENCOUNTER — Other Ambulatory Visit: Payer: Self-pay | Admitting: Internal Medicine

## 2022-10-01 ENCOUNTER — Other Ambulatory Visit: Payer: Self-pay

## 2022-10-01 DIAGNOSIS — D72829 Elevated white blood cell count, unspecified: Secondary | ICD-10-CM

## 2022-10-01 DIAGNOSIS — D75839 Thrombocytosis, unspecified: Secondary | ICD-10-CM

## 2022-10-01 DIAGNOSIS — D473 Essential (hemorrhagic) thrombocythemia: Secondary | ICD-10-CM

## 2022-10-05 ENCOUNTER — Telehealth: Payer: Self-pay | Admitting: Hematology and Oncology

## 2022-10-05 NOTE — Telephone Encounter (Signed)
Rescheduled appointment per patient per scheduling message. Patient is aware of the changes made to his upcoming appointment.

## 2022-10-06 ENCOUNTER — Inpatient Hospital Stay: Payer: Medicare HMO

## 2022-10-06 ENCOUNTER — Inpatient Hospital Stay: Payer: Medicare HMO | Admitting: Hematology and Oncology

## 2022-10-26 ENCOUNTER — Inpatient Hospital Stay: Payer: Medicare HMO | Attending: Hematology and Oncology

## 2022-10-26 ENCOUNTER — Other Ambulatory Visit: Payer: Self-pay

## 2022-10-26 ENCOUNTER — Inpatient Hospital Stay: Payer: Medicare HMO | Admitting: Hematology and Oncology

## 2022-10-26 ENCOUNTER — Encounter: Payer: Self-pay | Admitting: Hematology and Oncology

## 2022-10-26 VITALS — BP 163/95 | HR 97 | Temp 98.1°F | Resp 16 | Ht 72.0 in | Wt 253.3 lb

## 2022-10-26 DIAGNOSIS — I10 Essential (primary) hypertension: Secondary | ICD-10-CM | POA: Diagnosis not present

## 2022-10-26 DIAGNOSIS — D473 Essential (hemorrhagic) thrombocythemia: Secondary | ICD-10-CM | POA: Diagnosis not present

## 2022-10-26 DIAGNOSIS — D75839 Thrombocytosis, unspecified: Secondary | ICD-10-CM

## 2022-10-26 DIAGNOSIS — D72829 Elevated white blood cell count, unspecified: Secondary | ICD-10-CM

## 2022-10-26 DIAGNOSIS — Z87891 Personal history of nicotine dependence: Secondary | ICD-10-CM | POA: Diagnosis not present

## 2022-10-26 DIAGNOSIS — D7589 Other specified diseases of blood and blood-forming organs: Secondary | ICD-10-CM | POA: Diagnosis not present

## 2022-10-26 DIAGNOSIS — Z801 Family history of malignant neoplasm of trachea, bronchus and lung: Secondary | ICD-10-CM | POA: Diagnosis not present

## 2022-10-26 LAB — CMP (CANCER CENTER ONLY)
ALT: 7 U/L (ref 0–44)
AST: 15 U/L (ref 15–41)
Albumin: 3.9 g/dL (ref 3.5–5.0)
Alkaline Phosphatase: 57 U/L (ref 38–126)
Anion gap: 5 (ref 5–15)
BUN: 18 mg/dL (ref 8–23)
CO2: 27 mmol/L (ref 22–32)
Calcium: 9.5 mg/dL (ref 8.9–10.3)
Chloride: 108 mmol/L (ref 98–111)
Creatinine: 1.72 mg/dL — ABNORMAL HIGH (ref 0.61–1.24)
GFR, Estimated: 43 mL/min — ABNORMAL LOW (ref >60)
Glucose, Bld: 98 mg/dL (ref 70–99)
Potassium: 4.8 mmol/L (ref 3.5–5.1)
Sodium: 140 mmol/L (ref 135–145)
Total Bilirubin: 1.3 mg/dL — ABNORMAL HIGH (ref 0.3–1.2)
Total Protein: 7.1 g/dL (ref 6.5–8.1)

## 2022-10-26 LAB — CBC WITH DIFFERENTIAL (CANCER CENTER ONLY)
Abs Immature Granulocytes: 0.03 10*3/uL (ref 0.00–0.07)
Basophils Absolute: 0 10*3/uL (ref 0.0–0.1)
Basophils Relative: 0 %
Eosinophils Absolute: 0.1 10*3/uL (ref 0.0–0.5)
Eosinophils Relative: 1 %
HCT: 37.6 % — ABNORMAL LOW (ref 39.0–52.0)
Hemoglobin: 13.4 g/dL (ref 13.0–17.0)
Immature Granulocytes: 0 %
Lymphocytes Relative: 20 %
Lymphs Abs: 1.4 10*3/uL (ref 0.7–4.0)
MCH: 40.1 pg — ABNORMAL HIGH (ref 26.0–34.0)
MCHC: 35.6 g/dL (ref 30.0–36.0)
MCV: 112.6 fL — ABNORMAL HIGH (ref 80.0–100.0)
Monocytes Absolute: 0.7 10*3/uL (ref 0.1–1.0)
Monocytes Relative: 11 %
Neutro Abs: 4.7 10*3/uL (ref 1.7–7.7)
Neutrophils Relative %: 68 %
Platelet Count: 192 10*3/uL (ref 150–400)
RBC: 3.34 MIL/uL — ABNORMAL LOW (ref 4.22–5.81)
RDW: 14.3 % (ref 11.5–15.5)
WBC Count: 6.9 10*3/uL (ref 4.0–10.5)
nRBC: 0 % (ref 0.0–0.2)

## 2022-10-26 MED ORDER — HYDROXYUREA 500 MG PO CAPS: 500.0000 mg | ORAL_CAPSULE | Freq: Three times a day (TID) | ORAL | 4 refills | Status: DC

## 2022-10-26 NOTE — Progress Notes (Signed)
[facility] FOLLOW UP NOTE  Patient Care Team: [name], MD as PCP - General (Internal Medicine)  CHIEF COMPLAINTS/PURPOSE OF CONSULTATION:  ET follow up after hydrea dose modification  ASSESSMENT & PLAN:   This is a very pleasant 67 year old male patient with past medical history significant for hypertension referred to hematology for evaluation of persistent and extreme thrombocytosis, CALR mutated. He is currently on aspirin and Hydrea, tolerating it very well.   He is tolerating the current combination really well.  Labs from today show well-controlled thrombocytosis and macrocytosis from Hydrea.  No concerns on physical exam.  He will continue Hydrea, aspirin will return to clinic in about 6 months with repeat labs.  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 Sharlet Salina.  Thank you for consulting [place] in the care of this patient.  Please do not hesitate to contact us with any additional questions or concerns.  HISTORY OF PRESENTING ILLNESS:   Howard Green 67 y.o. male is here because of ET   Oncology History   No history exists.   INTERIM HISTORY  Patient is here for a follow-up.  He is now taking Hydrea 3 times a day along with aspirin as recommended.  He is tolerating this well.  He is keeping himself active with the horses.  He denies any complaints today.  No chest pain, chest pressure or shortness of breath.  He realizes the higher blood pressure when he comes to the office but at home he states it is better and he also tends to record lower with a manual machine.  He is very physically active.  No change in breathing, bowel or urinary habits.  No hematochezia or melena. Rest of the pertinent 10 point ROS reviewed and neg.  MEDICAL HISTORY:  Past Medical History:  Diagnosis Date   Allergy    seasonal like hayfever    Bleeding of eye, bilateral    Hypertension    Thyroid disease    benign  cyst removed 15 years ago    SURGICAL HISTORY: Past Surgical History:  Procedure Laterality Date   THYROID SURGERY      SOCIAL HISTORY: Social History   Socioeconomic History   Marital status: Widowed    Spouse name: Not on file   Number of children: Not on file   Years of education: Not on file   Highest education level: Not on file  Occupational History   Not on file  Tobacco Use   Smoking status: Never   Smokeless tobacco: Never  Vaping Use   Vaping Use: Former  Substance and Sexual Activity   Alcohol use: No    Alcohol/week: 0.0 standard drinks of alcohol   Drug use: No   Sexual activity: Not on file  Other Topics Concern   Not on file  Social History Narrative   Lives with wife in a one story home.  Has 4 children.     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      Social Determinants of Health   Financial Resource Strain: Low Risk  (07/09/2022)   Overall Financial Resource Strain (CARDIA)    Difficulty of Paying Living Expenses: Not hard at all  Food Insecurity: No Food Insecurity (07/09/2022)   Hunger Vital Sign    Worried About Running Out of Food in the Last Year: Never true    Ran Out of Food in the Last Year: Never true  Transportation Needs:  No Transportation Needs (07/09/2022)   PRAPARE - Hydrologist (Medical): No    Lack of Transportation (Non-Medical): No  Physical Activity: Sufficiently Active (07/09/2022)   Exercise Vital Sign    Days of Exercise per Week: 7 days    Minutes of Exercise per Session: 60 min  Stress: No Stress Concern Present (07/09/2022)   [place]    Feeling of Stress : Not at all  Social Connections: Moderately Integrated (07/09/2022)   Social Connection and Isolation Panel [NHANES]    Frequency of Communication with Friends and Family: More than three times a week    Frequency of Social Gatherings with Friends and Family: More  than three times a week    Attends Religious Services: More than 4 times per year    Active Member of [employer] or Organizations: Yes    Attends Archivist Meetings: More than 4 times per year    Marital Status: Widowed  Intimate Partner Violence: Not At Risk (07/09/2022)   Humiliation, Afraid, Rape, and Kick questionnaire    Fear of Current or Ex-Partner: No    Emotionally Abused: No    Physically Abused: No    Sexually Abused: No    FAMILY HISTORY: Family History  Problem Relation Age of Onset   Diabetes Mother    Other Mother        Good Pastures disease   Lung cancer Father        passed 35   Healthy Brother    Healthy Sister    Healthy Son    Healthy Daughter    Colon cancer Neg Hx    Rectal cancer Neg Hx    Stomach cancer Neg Hx     ALLERGIES:  has No Known Allergies.  MEDICATIONS:  Current Outpatient Medications  Medication Sig Dispense Refill   allopurinol (ZYLOPRIM) 300 MG tablet TAKE 1 TABLET(300 MG) BY MOUTH DAILY 90 tablet 0   amLODipine (NORVASC) 5 MG tablet TAKE 1 TABLET(5 MG) BY MOUTH DAILY 90 tablet 3   aspirin 81 MG tablet Take 81 mg by mouth daily.     fluorouracil (EFUDEX) 5 % cream Apply topically 2 (two) times daily. 80 g 0   hydroxyurea (HYDREA) 500 MG capsule Take 1 capsule (500 mg total) by mouth in the morning, at noon, and at bedtime. May take with food to minimize GI side effects. 90 capsule 4   metoprolol succinate (TOPROL-XL) 100 MG 24 hr tablet TAKE 1 TABLET(100 MG) BY MOUTH DAILY WITH OR IMMEDIATELY FOLLOWING A MEAL 90 tablet 3   No current facility-administered medications for this visit.    PHYSICAL EXAMINATION: ECOG PERFORMANCE STATUS: 0 - Asymptomatic  Vitals:   10/26/22 1116  BP: (!) 163/95  Pulse: 97  Resp: 16  Temp: 98.1 F (36.7 C)  SpO2: 100%   Filed Weights   10/26/22 1116  Weight: 253 lb 4.8 oz (114.9 kg)   Physical Exam Constitutional:      Appearance: Normal appearance.  HENT:     Head: Normocephalic  and atraumatic.  Cardiovascular:     Rate and Rhythm: Normal rate and regular rhythm.     Pulses: Normal pulses.     Heart sounds: Normal heart sounds.  Pulmonary:     Effort: Pulmonary effort is normal.     Breath sounds: Normal breath sounds.  Abdominal:     General: Abdomen is flat. Bowel sounds are normal.  Palpations: Abdomen is soft.  Musculoskeletal:        General: No swelling, tenderness or deformity.     Cervical back: Normal range of motion.  Skin:    General: Skin is warm and dry.  Neurological:     General: No focal deficit present.     Mental Status: He is alert.  Psychiatric:        Mood and Affect: Mood normal.        Behavior: Behavior normal.     LABORATORY DATA:  I have reviewed the data as listed Lab Results  Component Value Date   WBC 6.9 10/26/2022   HGB 13.4 10/26/2022   HCT 37.6 (L) 10/26/2022   MCV 112.6 (H) 10/26/2022   PLT 192 10/26/2022     Chemistry      Component Value Date/Time   NA 138 06/01/2022 1227   NA 139 06/14/2018 0000   K 4.3 06/01/2022 1227   CL 110 06/01/2022 1227   CO2 25 06/01/2022 1227   BUN 18 06/01/2022 1227   BUN 21 06/14/2018 0000   CREATININE 1.53 (H) 06/01/2022 1227   GLU 98 06/14/2018 0000      Component Value Date/Time   CALCIUM 9.2 06/01/2022 1227   ALKPHOS 61 06/01/2022 1227   AST 17 06/01/2022 1227   ALT 12 06/01/2022 1227   BILITOT 1.0 06/01/2022 1227     CBC from today reviewed.  RADIOGRAPHIC STUDIES: I have personally reviewed the radiological images as listed and agreed with the findings in the report. I spent 20 minutes in the care of this patient including history, review of records, counseling and coordination of care All questions were answered. The patient knows to call the clinic with any problems, questions or concerns.    Benay Pike, MD 10/26/2022 11:30 AM

## 2022-11-12 ENCOUNTER — Other Ambulatory Visit: Payer: Self-pay | Admitting: Internal Medicine

## 2022-12-16 ENCOUNTER — Other Ambulatory Visit: Payer: Self-pay | Admitting: Internal Medicine

## 2023-01-05 ENCOUNTER — Other Ambulatory Visit: Payer: Self-pay | Admitting: Internal Medicine

## 2023-02-01 ENCOUNTER — Ambulatory Visit (INDEPENDENT_AMBULATORY_CARE_PROVIDER_SITE_OTHER): Payer: Medicare HMO | Admitting: Internal Medicine

## 2023-02-01 ENCOUNTER — Encounter: Payer: Self-pay | Admitting: Internal Medicine

## 2023-02-01 VITALS — BP 142/82 | HR 122 | Temp 98.2°F | Ht 72.0 in | Wt 253.0 lb

## 2023-02-01 DIAGNOSIS — I10 Essential (primary) hypertension: Secondary | ICD-10-CM | POA: Diagnosis not present

## 2023-02-01 DIAGNOSIS — D75839 Thrombocytosis, unspecified: Secondary | ICD-10-CM | POA: Diagnosis not present

## 2023-02-01 DIAGNOSIS — N1831 Chronic kidney disease, stage 3a: Secondary | ICD-10-CM

## 2023-02-01 DIAGNOSIS — M1A3710 Chronic gout due to renal impairment, right ankle and foot, without tophus (tophi): Secondary | ICD-10-CM | POA: Diagnosis not present

## 2023-02-01 DIAGNOSIS — I4819 Other persistent atrial fibrillation: Secondary | ICD-10-CM | POA: Diagnosis not present

## 2023-02-01 LAB — COMPREHENSIVE METABOLIC PANEL
ALT: 8 U/L (ref 0–53)
AST: 15 U/L (ref 0–37)
Albumin: 4 g/dL (ref 3.5–5.2)
Alkaline Phosphatase: 51 U/L (ref 39–117)
BUN: 20 mg/dL (ref 6–23)
CO2: 25 mEq/L (ref 19–32)
Calcium: 9.2 mg/dL (ref 8.4–10.5)
Chloride: 108 mEq/L (ref 96–112)
Creatinine, Ser: 1.59 mg/dL — ABNORMAL HIGH (ref 0.40–1.50)
GFR: 44.56 mL/min — ABNORMAL LOW (ref >60.00)
Glucose, Bld: 91 mg/dL (ref 70–99)
Potassium: 4 mEq/L (ref 3.5–5.1)
Sodium: 140 mEq/L (ref 135–145)
Total Bilirubin: 1 mg/dL (ref 0.2–1.2)
Total Protein: 7.2 g/dL (ref 6.0–8.3)

## 2023-02-01 LAB — LIPID PANEL
Cholesterol: 131 mg/dL (ref 0–200)
HDL: 32.2 mg/dL — ABNORMAL LOW (ref >39.00)
LDL Cholesterol: 74 mg/dL (ref 0–99)
NonHDL: 98.82
Total CHOL/HDL Ratio: 4
Triglycerides: 124 mg/dL (ref 0.0–149.0)
VLDL: 24.8 mg/dL (ref 0.0–40.0)

## 2023-02-01 LAB — CBC
HCT: 40.3 % (ref 39.0–52.0)
Hemoglobin: 13.9 g/dL (ref 13.0–17.0)
MCHC: 34.5 g/dL (ref 30.0–36.0)
MCV: 112.7 fl — ABNORMAL HIGH (ref 78.0–100.0)
Platelets: 230 10*3/uL (ref 150.0–400.0)
RBC: 3.57 Mil/uL — ABNORMAL LOW (ref 4.22–5.81)
RDW: 14.3 % (ref 11.5–15.5)
WBC: 7.5 10*3/uL (ref 4.0–10.5)

## 2023-02-01 LAB — URIC ACID: Uric Acid, Serum: 5.7 mg/dL (ref 4.0–7.8)

## 2023-02-01 NOTE — Progress Notes (Unsigned)
   Subjective:   Patient ID: Howard Green, male    DOB: December 14, 1955, 67 y.o.   MRN: [id number]  HPI The patient is a 67 YO man coming in for follow up.   Review of Systems  Constitutional: Negative.   HENT: Negative.    Eyes: Negative.   Respiratory:  Negative for cough, chest tightness and shortness of breath.   Cardiovascular:  Negative for chest pain, palpitations and leg swelling.  Gastrointestinal:  Negative for abdominal distention, abdominal pain, constipation, diarrhea, nausea and vomiting.  Musculoskeletal: Negative.   Skin: Negative.   Neurological: Negative.   Psychiatric/Behavioral: Negative.      Objective:  Physical Exam Constitutional:      Appearance: He is well-developed.  HENT:     Head: Normocephalic and atraumatic.  Cardiovascular:     Rate and Rhythm: Normal rate and regular rhythm.  Pulmonary:     Effort: Pulmonary effort is normal. No respiratory distress.     Breath sounds: Normal breath sounds. No wheezing or rales.  Abdominal:     General: Bowel sounds are normal. There is no distension.     Palpations: Abdomen is soft.     Tenderness: There is no abdominal tenderness. There is no rebound.  Musculoskeletal:     Cervical back: Normal range of motion.  Skin:    General: Skin is warm and dry.  Neurological:     Mental Status: He is alert and oriented to person, place, and time.     Coordination: Coordination normal.     Vitals:   02/01/23 1308 02/01/23 1321  BP: (!) 140/100 (!) 142/82  Pulse: (!) 122   Temp: 98.2 F (36.8 C)   TempSrc: Oral   SpO2: 94%   Weight: 253 lb (114.8 kg)   Height: 6' (1.829 m)     Assessment & Plan:

## 2023-02-01 NOTE — Patient Instructions (Addendum)
For the cream to use on the scalp twice a day for 2 weeks. This will cause a chemical burn of the scalp to remove pre-cancerous spot.     We will check the labs today.

## 2023-02-02 NOTE — Assessment & Plan Note (Signed)
Checking uric acid and adjust allopurinol 300 mg daily as needed for uric acid <6 goal. No flare today.

## 2023-02-02 NOTE — Assessment & Plan Note (Signed)
Checking CMP for stability. BP mildly elevated today will keep regimen the same. At goal BP at home. No DM.

## 2023-02-02 NOTE — Assessment & Plan Note (Signed)
In A fib today and he is taking aspirin 81 mg daily does not want to take stronger anticoagulation. Rate controlled with metoprolol 100 mg daily.

## 2023-02-02 NOTE — Assessment & Plan Note (Signed)
BP mildly high today and is normal at home. Can increase amlodipine to 10 mg daily if needed. Keep same amlodipine 5 mg daily and metoprolol 100 mg daily for now. Checking CMP and CBC and uric acid. Adjust if any change.

## 2023-02-02 NOTE — Assessment & Plan Note (Signed)
Checking CBC today. Seeing oncology regularly.

## 2023-04-02 ENCOUNTER — Other Ambulatory Visit: Payer: Self-pay | Admitting: Internal Medicine

## 2023-04-26 ENCOUNTER — Other Ambulatory Visit: Payer: Self-pay

## 2023-04-26 ENCOUNTER — Inpatient Hospital Stay: Payer: Medicare HMO | Admitting: Hematology and Oncology

## 2023-04-26 ENCOUNTER — Inpatient Hospital Stay: Payer: Medicare HMO | Attending: Hematology and Oncology

## 2023-04-26 VITALS — BP 151/92 | HR 82 | Temp 97.9°F | Resp 16 | Wt 254.3 lb

## 2023-04-26 DIAGNOSIS — D473 Essential (hemorrhagic) thrombocythemia: Secondary | ICD-10-CM

## 2023-04-26 DIAGNOSIS — Z801 Family history of malignant neoplasm of trachea, bronchus and lung: Secondary | ICD-10-CM | POA: Diagnosis not present

## 2023-04-26 DIAGNOSIS — D72829 Elevated white blood cell count, unspecified: Secondary | ICD-10-CM

## 2023-04-26 DIAGNOSIS — Z87891 Personal history of nicotine dependence: Secondary | ICD-10-CM | POA: Diagnosis not present

## 2023-04-26 DIAGNOSIS — D75839 Thrombocytosis, unspecified: Secondary | ICD-10-CM | POA: Diagnosis not present

## 2023-04-26 DIAGNOSIS — D7589 Other specified diseases of blood and blood-forming organs: Secondary | ICD-10-CM | POA: Diagnosis not present

## 2023-04-26 LAB — CMP (CANCER CENTER ONLY)
ALT: 9 U/L (ref 0–44)
AST: 15 U/L (ref 15–41)
Albumin: 4 g/dL (ref 3.5–5.0)
Alkaline Phosphatase: 49 U/L (ref 38–126)
Anion gap: 6 (ref 5–15)
BUN: 20 mg/dL (ref 8–23)
CO2: 26 mmol/L (ref 22–32)
Calcium: 9.1 mg/dL (ref 8.9–10.3)
Chloride: 109 mmol/L (ref 98–111)
Creatinine: 1.71 mg/dL — ABNORMAL HIGH (ref 0.61–1.24)
GFR, Estimated: 43 mL/min — ABNORMAL LOW (ref >60)
Glucose, Bld: 96 mg/dL (ref 70–99)
Potassium: 4.2 mmol/L (ref 3.5–5.1)
Sodium: 141 mmol/L (ref 135–145)
Total Bilirubin: 0.9 mg/dL (ref 0.3–1.2)
Total Protein: 6.8 g/dL (ref 6.5–8.1)

## 2023-04-26 LAB — CBC WITH DIFFERENTIAL (CANCER CENTER ONLY)
Abs Immature Granulocytes: 0.02 10*3/uL (ref 0.00–0.07)
Basophils Absolute: 0 10*3/uL (ref 0.0–0.1)
Basophils Relative: 0 %
Eosinophils Absolute: 0 10*3/uL (ref 0.0–0.5)
Eosinophils Relative: 1 %
HCT: 41.1 % (ref 39.0–52.0)
Hemoglobin: 14.4 g/dL (ref 13.0–17.0)
Immature Granulocytes: 0 %
Lymphocytes Relative: 20 %
Lymphs Abs: 1.3 10*3/uL (ref 0.7–4.0)
MCH: 38 pg — ABNORMAL HIGH (ref 26.0–34.0)
MCHC: 35 g/dL (ref 30.0–36.0)
MCV: 108.4 fL — ABNORMAL HIGH (ref 80.0–100.0)
Monocytes Absolute: 0.5 10*3/uL (ref 0.1–1.0)
Monocytes Relative: 7 %
Neutro Abs: 4.9 10*3/uL (ref 1.7–7.7)
Neutrophils Relative %: 72 %
Platelet Count: 249 10*3/uL (ref 150–400)
RBC: 3.79 MIL/uL — ABNORMAL LOW (ref 4.22–5.81)
RDW: 12.7 % (ref 11.5–15.5)
WBC Count: 6.8 10*3/uL (ref 4.0–10.5)
nRBC: 0 % (ref 0.0–0.2)

## 2023-04-26 NOTE — Progress Notes (Signed)
[facility] FOLLOW UP NOTE  Patient Care Team: Myrlene Broker, MD as PCP - General (Internal Medicine)  CHIEF COMPLAINTS/PURPOSE OF CONSULTATION:  ET follow up   ASSESSMENT & PLAN:   This is a very pleasant 67 year old male patient with past medical history significant for hypertension referred to hematology for evaluation of persistent and extreme thrombocytosis, CALR mutated. He is currently on aspirin and Hydrea, tolerating it very well.   He is tolerating the current combination really well.  Labs from today show well-controlled thrombocytosis and macrocytosis from Hydrea.  No concerns on physical exam.  He will continue Hydrea, aspirin will return to clinic in about 6 months with repeat labs.  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 Okey Dupre. No change in his health today compared to his last visit.  Thank you for consulting [place] in the care of this patient.  Please do not hesitate to contact us with any additional questions or concerns.  HISTORY OF PRESENTING ILLNESS:   Howard Green 67 y.o. male is here because of ET   Oncology History   No history exists.   INTERIM HISTORY  Patient is here for a follow-up.  He is on Hydrea with aspirin.  He is tolerating this very well.  He has been taking about 1500 mg daily of Hydrea.   He denies any B symptoms today.  He has been eating well.  No toxicity reported with Hydrea.  No change in breathing, bowel habits or urinary habits.  No new neurological complaints. Rest of the pertinent 10 point ROS reviewed and neg.  MEDICAL HISTORY:  Past Medical History:  Diagnosis Date   Allergy    seasonal like hayfever    Bleeding of eye, bilateral    Hypertension    Thyroid disease    benign cyst removed 15 years ago    SURGICAL HISTORY: Past Surgical History:  Procedure Laterality Date   THYROID SURGERY      SOCIAL HISTORY: Social History    Socioeconomic History   Marital status: Widowed    Spouse name: Not on file   Number of children: Not on file   Years of education: Not on file   Highest education level: Not on file  Occupational History   Not on file  Tobacco Use   Smoking status: Never   Smokeless tobacco: Never  Vaping Use   Vaping status: Former  Substance and Sexual Activity   Alcohol use: No    Alcohol/week: 0.0 standard drinks of alcohol   Drug use: No   Sexual activity: Not on file  Other Topics Concern   Not on file  Social History Narrative   Lives with wife in a one story home.  Has 4 children.     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      Social Determinants of Health   Financial Resource Strain: Low Risk  (07/09/2022)   Overall Financial Resource Strain (CARDIA)    Difficulty of Paying Living Expenses: Not hard at all  Food Insecurity: No Food Insecurity (07/09/2022)   Hunger Vital Sign    Worried About Running Out of Food in the Last Year: Never true    Ran Out of Food in the Last Year: Never true  Transportation Needs: No Transportation Needs (07/09/2022)   PRAPARE - Administrator, Civil Service (Medical): No    Lack of Transportation (Non-Medical): No  Physical Activity: Sufficiently Active (  07/09/2022)   Exercise Vital Sign    Days of Exercise per Week: 7 days    Minutes of Exercise per Session: 60 min  Stress: No Stress Concern Present (07/09/2022)   [employer] of Occupational Health - Occupational Stress Questionnaire    Feeling of Stress : Not at all  Social Connections: Moderately Integrated (07/09/2022)   Social Connection and Isolation Panel [NHANES]    Frequency of Communication with Friends and Family: More than three times a week    Frequency of Social Gatherings with Friends and Family: More than three times a week    Attends Religious Services: More than 4 times per year    Active Member of [employer] or Organizations: Yes    Attends Tax inspector Meetings: More than 4 times per year    Marital Status: Widowed  Intimate Partner Violence: Not At Risk (07/09/2022)   Humiliation, Afraid, Rape, and Kick questionnaire    Fear of Current or Ex-Partner: No    Emotionally Abused: No    Physically Abused: No    Sexually Abused: No    FAMILY HISTORY: Family History  Problem Relation Age of Onset   Diabetes Mother    Other Mother        Good Pastures disease   Lung cancer Father        passed 47   Healthy Brother    Healthy Sister    Healthy Son    Healthy Daughter    Colon cancer Neg Hx    Rectal cancer Neg Hx    Stomach cancer Neg Hx     ALLERGIES:  has No Known Allergies.  MEDICATIONS:  Current Outpatient Medications  Medication Sig Dispense Refill   allopurinol (ZYLOPRIM) 300 MG tablet TAKE 1 TABLET(300 MG) BY MOUTH DAILY 90 tablet 0   amLODipine (NORVASC) 5 MG tablet TAKE 1 TABLET(5 MG) BY MOUTH DAILY 90 tablet 3   aspirin 81 MG tablet Take 81 mg by mouth daily.     fluorouracil (EFUDEX) 5 % cream Apply topically 2 (two) times daily. 80 g 0   hydroxyurea (HYDREA) 500 MG capsule Take 1 capsule (500 mg total) by mouth in the morning, at noon, and at bedtime. May take with food to minimize GI side effects. 90 capsule 4   metoprolol succinate (TOPROL-XL) 100 MG 24 hr tablet TAKE 1 TABLET(100 MG) BY MOUTH DAILY WITH OR IMMEDIATELY FOLLOWING A MEAL 90 tablet 3   No current facility-administered medications for this visit.    PHYSICAL EXAMINATION: ECOG PERFORMANCE STATUS: 0 - Asymptomatic  Vitals:   04/26/23 1104  BP: (!) 151/92  Pulse: 82  Resp: 16  Temp: 97.9 F (36.6 C)  SpO2: 100%   Filed Weights   04/26/23 1104  Weight: 254 lb 4.8 oz (115.3 kg)   Physical Exam Constitutional:      Appearance: Normal appearance.  HENT:     Head: Normocephalic and atraumatic.  Cardiovascular:     Rate and Rhythm: Normal rate and regular rhythm.     Pulses: Normal pulses.     Heart sounds: Normal heart  sounds.  Pulmonary:     Effort: Pulmonary effort is normal.     Breath sounds: Normal breath sounds.  Abdominal:     General: Abdomen is flat. Bowel sounds are normal.     Palpations: Abdomen is soft.  Musculoskeletal:        General: No swelling, tenderness or deformity.     Cervical back: Normal range of  motion.  Skin:    General: Skin is warm and dry.  Neurological:     General: No focal deficit present.     Mental Status: He is alert.  Psychiatric:        Mood and Affect: Mood normal.        Behavior: Behavior normal.     LABORATORY DATA:  I have reviewed the data as listed Lab Results  Component Value Date   WBC 6.8 04/26/2023   HGB 14.4 04/26/2023   HCT 41.1 04/26/2023   MCV 108.4 (H) 04/26/2023   PLT 249 04/26/2023     Chemistry      Component Value Date/Time   NA 140 02/01/2023 1326   NA 139 06/14/2018 0000   K 4.0 02/01/2023 1326   CL 108 02/01/2023 1326   CO2 25 02/01/2023 1326   BUN 20 02/01/2023 1326   BUN 21 06/14/2018 0000   CREATININE 1.59 (H) 02/01/2023 1326   CREATININE 1.72 (H) 10/26/2022 1055   GLU 98 06/14/2018 0000      Component Value Date/Time   CALCIUM 9.2 02/01/2023 1326   ALKPHOS 51 02/01/2023 1326   AST 15 02/01/2023 1326   AST 15 10/26/2022 1055   ALT 8 02/01/2023 1326   ALT 7 10/26/2022 1055   BILITOT 1.0 02/01/2023 1326   BILITOT 1.3 (H) 10/26/2022 1055      CBC from today shows white blood cell count of 6.8 thousand, hemoglobin of 14.4, microcytosis consistent with Hydrea use and platelet count of 249,000. CMP from today shows creatinine of 1.71 which is his baseline.  RADIOGRAPHIC STUDIES: I have personally reviewed the radiological images as listed and agreed with the findings in the report. I spent 20 minutes in the care of this patient including history, review of records, counseling and coordination of care All questions were answered. The patient knows to call the clinic with any problems, questions or concerns.     Rachel Moulds, MD 04/26/2023 11:24 AM

## 2023-05-04 ENCOUNTER — Other Ambulatory Visit: Payer: Self-pay | Admitting: Hematology and Oncology

## 2023-06-21 ENCOUNTER — Ambulatory Visit (INDEPENDENT_AMBULATORY_CARE_PROVIDER_SITE_OTHER): Payer: Medicare HMO

## 2023-06-21 VITALS — Ht 72.0 in | Wt 252.0 lb

## 2023-06-21 DIAGNOSIS — Z0000 Encounter for general adult medical examination without abnormal findings: Secondary | ICD-10-CM | POA: Diagnosis not present

## 2023-06-21 NOTE — Patient Instructions (Signed)
Howard Green , Thank you for taking time to come for your Medicare Wellness Visit. I appreciate your ongoing commitment to your health goals. Please review the following plan we discussed and let me know if I can assist you in the future.   Referrals/Orders/Follow-Ups/Clinician Recommendations: Remember to discuss the Cologuard with Dr. Okey Dupre at your next office visit.  Also discuss the Hep C screening with her.  It was nice talking with you today and keep up the good work.  This is a list of the screening recommended for you and due dates:  Health Maintenance  Topic Date Due   Hepatitis C Screening  Never done   Zoster (Shingles) Vaccine (1 of 2) Never done   COVID-19 Vaccine (3 - Pfizer risk series) 02/05/2020   Pneumonia Vaccine (1 of 1 - PCV) Never done   Flu Shot  Never done   Colon Cancer Screening  02/01/2024*   Medicare Annual Wellness Visit  06/20/2024   DTaP/Tdap/Td vaccine (2 - Td or Tdap) 03/20/2025   HPV Vaccine  Aged Out  *Topic was postponed. The date shown is not the original due date.    Advanced directives: (Copy Requested) Please bring a copy of your health care power of attorney and living will to the office to be added to your chart at your convenience.  Next Medicare Annual Wellness Visit scheduled for next year: Yes

## 2023-06-21 NOTE — Progress Notes (Signed)
Subjective:   Howard Green is a 67 y.o. male who presents for Medicare Annual/Subsequent preventive examination.  Visit Complete: Virtual  I connected with  Bary Leriche on 06/21/23 by a audio enabled telemedicine application and verified that I am speaking with the correct person using two identifiers.  Patient Location: Home  Provider Location: Home Office  I discussed the limitations of evaluation and management by telemedicine. The patient expressed understanding and agreed to proceed.  Vital Signs: Because this visit was a virtual/telehealth visit, some criteria may be missing or patient reported. Any vitals not documented were not able to be obtained and vitals that have been documented are patient reported.    Cardiac Risk Factors include: advanced age (>81men, >66 women);hypertension;male gender;obesity (BMI >30kg/m2);Other (see comment), Risk factor comments: A-Fib, CKD     Objective:    Today's Vitals   06/21/23 1347  Weight: 252 lb (114.3 kg)  Height: 6' (1.829 m)   Body mass index is 34.18 kg/m.     06/21/2023    1:52 PM 07/09/2022    9:50 AM 10/29/2020   12:38 PM 10/15/2020    3:07 PM 09/02/2019    8:54 AM 07/21/2018    4:28 PM 03/02/2016    8:06 PM  Advanced Directives  Does Patient Have a Medical Advance Directive? Yes No No No No No No  Type of Estate agent of [place];Living will        Copy of Healthcare Power of Attorney in Chart? No - copy requested        Would patient like information on creating a medical advance directive?  No - Patient declined No - Patient declined No - Patient declined  No - Patient declined No - patient declined information    Current Medications (verified) Outpatient Encounter Medications as of 06/21/2023  Medication Sig   allopurinol (ZYLOPRIM) 300 MG tablet TAKE 1 TABLET(300 MG) BY MOUTH DAILY   amLODipine (NORVASC) 5 MG tablet TAKE 1 TABLET(5 MG) BY MOUTH DAILY   aspirin 81 MG tablet Take  81 mg by mouth daily.   fluorouracil (EFUDEX) 5 % cream Apply topically 2 (two) times daily.   hydroxyurea (HYDREA) 500 MG capsule TAKE 1 CAPSULE BY MOUTH IN THE MORNING, AT NOON, AND BEDTIME. MAY TAKE WITH FOOD TO MINIMIZE GI SIDE EFFECTS   metoprolol succinate (TOPROL-XL) 100 MG 24 hr tablet TAKE 1 TABLET(100 MG) BY MOUTH DAILY WITH OR IMMEDIATELY FOLLOWING A MEAL   No facility-administered encounter medications on file as of 06/21/2023.    Allergies (verified) Patient has no known allergies.   History: Past Medical History:  Diagnosis Date   Allergy    seasonal like hayfever    Bleeding of eye, bilateral    Hypertension    Thyroid disease    benign cyst removed 15 years ago   Past Surgical History:  Procedure Laterality Date   THYROID SURGERY     Family History  Problem Relation Age of Onset   Diabetes Mother    Other Mother        Good Pastures disease   Lung cancer Father        passed 40   Healthy Brother    Healthy Sister    Healthy Son    Healthy Daughter    Colon cancer Neg Hx    Rectal cancer Neg Hx    Stomach cancer Neg Hx    Social History   Socioeconomic History   Marital status: Widowed  Spouse name: Not on file   Number of children: 4   Years of education: Not on file   Highest education level: Not on file  Occupational History   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  Has 4 children.     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      Social Determinants of Health   Financial Resource Strain: Low Risk  (06/21/2023)   Overall Financial Resource Strain (CARDIA)     Difficulty of Paying Living Expenses: Not hard at all  Food Insecurity: No Food Insecurity (06/21/2023)   Hunger Vital Sign    Worried About Running Out of Food in the Last Year: Never true    Ran Out of Food in the Last Year: Never true  Transportation Needs: No Transportation Needs (06/21/2023)   PRAPARE - Administrator, Civil Service (Medical): No    Lack of Transportation (Non-Medical): No  Physical Activity: Sufficiently Active (06/21/2023)   Exercise Vital Sign    Days of Exercise per Week: 7 days    Minutes of Exercise per Session: 60 min  Stress: No Stress Concern Present (06/21/2023)   [employer] of Occupational Health - Occupational Stress Questionnaire    Feeling of Stress : Not at all  Social Connections: Socially Isolated (06/21/2023)   Social Connection and Isolation Panel [NHANES]    Frequency of Communication with Friends and Family: More than three times a week    Frequency of Social Gatherings with Friends and Family: More than three times a week    Attends Religious Services: Never    Database administrator or Organizations: No    Attends Banker Meetings: Never    Marital Status: Widowed    Tobacco Counseling Counseling given: Not Answered   Clinical Intake:  Pre-visit preparation completed: Yes  Pain : No/denies pain     BMI - recorded: 34.18 Nutritional Status: BMI > 30  Obese Nutritional Risks: None Diabetes: No  How often do you need to have someone help you when you read instructions, pamphlets, or other written materials from your doctor or pharmacy?: 1 - Never  Interpreter Needed?: No  Information entered by :: Fonda Rochon, RMA   Activities of Daily Living    06/21/2023    1:50 PM 07/09/2022    9:58 AM  In your present state of health, do you have any difficulty performing the following activities:  Hearing? 0 0  Vision? 0 0  Difficulty concentrating or making decisions? 0 0  Walking or climbing stairs? 0  0  Dressing or bathing? 0 0  Doing errands, shopping? 0 0  Preparing Food and eating ? N N  Using the Toilet? N N  In the past six months, have you accidently leaked urine? N N  Do you have problems with loss of bowel control? N N  Managing your Medications? N N  Managing your Finances? N N  Housekeeping or managing your Housekeeping? N N    Patient Care Team: Okey Dupre,  Austin Miles, MD as PCP - General (Internal Medicine)  Indicate any recent Medical Services you may have received from other than [facility] providers in the past year (date may be approximate).     Assessment:   This is a routine wellness examination for [place].  Hearing/Vision screen Hearing Screening - Comments:: Denies hearing difficulties   Vision Screening - Comments:: Wears eyeglasses.   Goals Addressed             This Visit's Progress    To maintain my current health status by continuing to eat healthy, stay physically active and socially active.   On track     Depression Screen    06/21/2023    1:59 PM 08/04/2022   10:54 AM 07/09/2022    9:58 AM 01/26/2022    1:37 PM 09/19/2020    2:16 PM 09/03/2019    2:57 PM 03/03/2017   10:34 AM  PHQ 2/9 Scores  PHQ - 2 Score 0 0 0 0 0 0 0  PHQ- 9 Score 0 0  0 0  0    Fall Risk    06/21/2023    1:53 PM 08/04/2022   10:55 AM 07/09/2022    9:50 AM 01/26/2022    1:37 PM 09/19/2020    2:16 PM  Fall Risk   Falls in the past year? 0 0 0 0 0  Number falls in past yr: 0 0 0 0 0  Injury with Fall? 0 0 0 0 0  Risk for fall due to : No Fall Risks  No Fall Risks    Follow up Falls prevention discussed;Falls evaluation completed  Falls prevention discussed      MEDICARE RISK AT HOME: Medicare Risk at Home Any stairs in or around the home?: No Home free of loose throw rugs in walkways, pet beds, electrical cords, etc?: Yes Adequate lighting in your home to reduce risk of falls?: Yes Life alert?: No Use of a cane, walker or w/c?: No Grab bars in the bathroom?:  No Shower chair or bench in shower?: No Elevated toilet seat or a handicapped toilet?: No  TIMED UP AND GO:  Was the test performed?  No    Cognitive Function:        06/21/2023    1:56 PM 07/09/2022    9:59 AM  6CIT Screen  What Year? 0 points 0 points  What month? 0 points 0 points  What time? 0 points 0 points  Count back from 20 0 points 0 points  Months in reverse 0 points 0 points  Repeat phrase 0 points 0 points  Total Score 0 points 0 points    Immunizations Immunization History  Administered Date(s) Administered   PFIZER(Purple Top)SARS-COV-2 Vaccination 12/18/2019, 01/08/2020   Tdap 03/21/2015    TDAP status: Up to date  Flu Vaccine status: Declined, Education has been provided regarding the importance of this vaccine but patient still declined. Advised may receive this vaccine at local pharmacy or Health Dept. Aware to provide a copy of the vaccination record if obtained from local pharmacy or Health Dept. Verbalized acceptance and understanding.  Pneumococcal vaccine status: Declined,  Education has been provided regarding the importance of this vaccine but patient still declined. Advised may receive this vaccine at local pharmacy or Health Dept. Aware to provide a copy of the vaccination record if obtained from local pharmacy or Health Dept. Verbalized acceptance and understanding.   Covid-19 vaccine status: Completed vaccines  Qualifies for Shingles Vaccine? Yes  Zostavax completed No   Shingrix Completed?: No.    Education has been provided regarding the importance of this vaccine. Patient has been advised to call insurance company to determine out of pocket expense if they have not yet received this vaccine. Advised may also receive vaccine at local pharmacy or Health Dept. Verbalized acceptance and understanding.  Screening Tests Health Maintenance  Topic Date Due   Hepatitis C Screening  Never done   Zoster Vaccines- Shingrix (1 of 2) Never done    COVID-19 Vaccine (3 - Pfizer risk series) 02/05/2020   Pneumonia Vaccine 12+ Years old (1 of 1 - PCV) Never done   INFLUENZA VACCINE  Never done   Colonoscopy  02/01/2024 (Originally 04/26/2000)   Medicare Annual Wellness (AWV)  06/20/2024   DTaP/Tdap/Td (2 - Td or Tdap) 03/20/2025   HPV VACCINES  Aged Out    Health Maintenance  Health Maintenance Due  Topic Date Due   Hepatitis C Screening  Never done   Zoster Vaccines- Shingrix (1 of 2) Never done   COVID-19 Vaccine (3 - Pfizer risk series) 02/05/2020   Pneumonia Vaccine 32+ Years old (1 of 1 - PCV) Never done   INFLUENZA VACCINE  Never done    Lung Cancer Screening: (Low Dose CT Chest recommended if Age 61-80 years, 20 pack-year currently smoking OR have quit w/in 15years.) does not qualify.   Lung Cancer Screening Referral: N/A  Additional Screening:  Hepatitis C Screening: does qualify;   Vision Screening: Recommended annual ophthalmology exams for early detection of glaucoma and other disorders of the eye. Is the patient up to date with their annual eye exam?  Yes  Who is the provider or what is the name of the office in which the patient attends [facility]  If pt is not established with a provider, would they like to be referred to a provider to establish care? No .   Dental Screening: Recommended annual dental exams for proper oral hygiene   Community Resource Referral / Chronic Care Management: CRR required this visit?  No   CCM required this visit?  No     Plan:     I have personally reviewed and noted the following in the patient's chart:   Medical and social history Use of alcohol, tobacco or illicit drugs  Current medications and supplements including opioid prescriptions. Patient is not currently taking opioid prescriptions. Functional ability and status Nutritional status Physical activity Advanced directives List of other physicians Hospitalizations,  surgeries, and ER visits in previous 12 months Vitals Screenings to include cognitive, depression, and falls Referrals and appointments  In addition, I have reviewed and discussed with patient certain preventive protocols, quality metrics, and best practice recommendations. A written personalized care plan for preventive services as well as general preventive health recommendations were provided to patient.    Shante Archambeault L Enoch Moffa, CMA   06/21/2023   After Visit Summary: (Mail) Due to this being a telephonic visit, the after visit summary with patients personalized plan was offered to patient via mail    Nurse Notes: Patient is due for Flu, Pneumonia, and Shingrix vaccine, however patient declines them all.  Patient has not had a colonoscopy but did say he would discuss maybe the Cologuard with Dr. Okey Dupre.  He also never had a Hep C screening, which he would like to discuss that as well with her.  Patient had no other concerns to address today.

## 2023-07-02 ENCOUNTER — Other Ambulatory Visit: Payer: Self-pay | Admitting: Internal Medicine

## 2023-09-20 ENCOUNTER — Inpatient Hospital Stay: Payer: Medicare HMO

## 2023-09-20 ENCOUNTER — Inpatient Hospital Stay: Payer: Medicare HMO | Attending: Hematology and Oncology | Admitting: Hematology and Oncology

## 2023-09-20 VITALS — BP 169/96 | HR 106 | Temp 98.6°F | Resp 16 | Wt 245.8 lb

## 2023-09-20 DIAGNOSIS — D72829 Elevated white blood cell count, unspecified: Secondary | ICD-10-CM

## 2023-09-20 DIAGNOSIS — D75839 Thrombocytosis, unspecified: Secondary | ICD-10-CM | POA: Diagnosis not present

## 2023-09-20 DIAGNOSIS — Z801 Family history of malignant neoplasm of trachea, bronchus and lung: Secondary | ICD-10-CM | POA: Insufficient documentation

## 2023-09-20 DIAGNOSIS — I129 Hypertensive chronic kidney disease with stage 1 through stage 4 chronic kidney disease, or unspecified chronic kidney disease: Secondary | ICD-10-CM | POA: Insufficient documentation

## 2023-09-20 DIAGNOSIS — D473 Essential (hemorrhagic) thrombocythemia: Secondary | ICD-10-CM

## 2023-09-20 DIAGNOSIS — N189 Chronic kidney disease, unspecified: Secondary | ICD-10-CM | POA: Diagnosis not present

## 2023-09-20 DIAGNOSIS — R634 Abnormal weight loss: Secondary | ICD-10-CM | POA: Insufficient documentation

## 2023-09-20 DIAGNOSIS — Z87891 Personal history of nicotine dependence: Secondary | ICD-10-CM | POA: Insufficient documentation

## 2023-09-20 LAB — CMP (CANCER CENTER ONLY)
ALT: 7 U/L (ref 0–44)
AST: 14 U/L — ABNORMAL LOW (ref 15–41)
Albumin: 4 g/dL (ref 3.5–5.0)
Alkaline Phosphatase: 52 U/L (ref 38–126)
Anion gap: 4 — ABNORMAL LOW (ref 5–15)
BUN: 19 mg/dL (ref 8–23)
CO2: 28 mmol/L (ref 22–32)
Calcium: 9.4 mg/dL (ref 8.9–10.3)
Chloride: 108 mmol/L (ref 98–111)
Creatinine: 1.67 mg/dL — ABNORMAL HIGH (ref 0.61–1.24)
GFR, Estimated: 44 mL/min — ABNORMAL LOW (ref >60)
Glucose, Bld: 87 mg/dL (ref 70–99)
Potassium: 4.4 mmol/L (ref 3.5–5.1)
Sodium: 140 mmol/L (ref 135–145)
Total Bilirubin: 1.2 mg/dL — ABNORMAL HIGH (ref <1.2)
Total Protein: 7.1 g/dL (ref 6.5–8.1)

## 2023-09-20 LAB — CBC WITH DIFFERENTIAL (CANCER CENTER ONLY)
Abs Immature Granulocytes: 0.02 10*3/uL (ref 0.00–0.07)
Basophils Absolute: 0.1 10*3/uL (ref 0.0–0.1)
Basophils Relative: 1 %
Eosinophils Absolute: 0.1 10*3/uL (ref 0.0–0.5)
Eosinophils Relative: 1 %
HCT: 40.6 % (ref 39.0–52.0)
Hemoglobin: 14.3 g/dL (ref 13.0–17.0)
Immature Granulocytes: 0 %
Lymphocytes Relative: 18 %
Lymphs Abs: 1.4 10*3/uL (ref 0.7–4.0)
MCH: 35.6 pg — ABNORMAL HIGH (ref 26.0–34.0)
MCHC: 35.2 g/dL (ref 30.0–36.0)
MCV: 101 fL — ABNORMAL HIGH (ref 80.0–100.0)
Monocytes Absolute: 0.4 10*3/uL (ref 0.1–1.0)
Monocytes Relative: 6 %
Neutro Abs: 5.6 10*3/uL (ref 1.7–7.7)
Neutrophils Relative %: 74 %
Platelet Count: 218 10*3/uL (ref 150–400)
RBC: 4.02 MIL/uL — ABNORMAL LOW (ref 4.22–5.81)
RDW: 14.1 % (ref 11.5–15.5)
WBC Count: 7.6 10*3/uL (ref 4.0–10.5)
nRBC: 0 % (ref 0.0–0.2)

## 2023-09-20 MED ORDER — HYDROXYUREA 500 MG PO CAPS: 500.0000 mg | ORAL_CAPSULE | Freq: Three times a day (TID) | ORAL | 4 refills | Status: DC

## 2023-09-20 NOTE — Progress Notes (Signed)
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  ET, CALR mutated. Stable on Hydroxyurea with good blood counts. No new side effects reported. -Continue Hydroxyurea 3 times daily. -Refill Hydroxyurea prescription.  Chronic Kidney Disease Stable, no significant change from last year. -Continue current management.  Weight Loss Intentional, due to dietary changes and increased activity. -Monitor weight and continue current lifestyle modifications.  Follow-up in 6 months with labs.  Thank you for consulting [place] in the care of this patient.  Please do not hesitate to contact us with any additional questions or concerns.  HISTORY OF PRESENTING ILLNESS:   Howard Green 67 y.o. male is here because of ET   Oncology History   No history exists.   INTERIM HISTORY  Discussed the use of AI scribe software for clinical note transcription with the patient, who gave verbal consent to proceed.  History of Present Illness        The patient, with a history of CALR mutated ET, hypertension and kidney disease, presents for a routine follow-up. He reports adherence to his medication regimen, including hydrea and aspirin, but occasionally misses doses due to a busy schedule. He has experienced unintentional weight loss, which he attributes to a busy summer and dietary changes, including reduced pasta intake. He denies any new side effects from his medications, but notes that he tries to eat something when taking his medications to prevent upset stomach. He also reports no new symptoms or changes in his health status. Rest of the pertinent 10  point ROS reviewed and neg.  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  Has 4 children.     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      Social Drivers of Corporate investment banker Strain: Low Risk  (06/21/2023)   Overall Financial Resource Strain (CARDIA)    Difficulty of Paying Living Expenses: Not hard at all  Food Insecurity: No Food Insecurity (06/21/2023)   Hunger Vital Sign    Worried About Running Out of Food in the Last Year: Never  true    Ran Out of Food in the Last Year: Never true  Transportation Needs: No Transportation Needs (06/21/2023)   PRAPARE - Administrator, Civil Service (Medical): No    Lack of Transportation (Non-Medical): No  Physical Activity: Sufficiently Active (06/21/2023)   Exercise Vital Sign    Days of Exercise per Week: 7 days    Minutes of Exercise per Session: 60 min  Stress: No Stress Concern Present (06/21/2023)   [employer] of Occupational Health - Occupational Stress Questionnaire    Feeling of Stress : Not at all  Social  Connections: Socially Isolated (06/21/2023)   Social Connection and Isolation Panel [NHANES]    Frequency of Communication with Friends and Family: More than three times a week    Frequency of Social Gatherings with Friends and Family: More than three times a week    Attends Religious Services: Never    Database administrator or Organizations: No    Attends Banker Meetings: Never    Marital Status: Widowed  Intimate Partner Violence: Not At Risk (06/21/2023)   Humiliation, Afraid, Rape, and Kick questionnaire    Fear of Current or Ex-Partner: No    Emotionally Abused: No    Physically Abused: No    Sexually Abused: No    FAMILY HISTORY: Family History  Problem Relation Age of Onset   Diabetes Mother    Other Mother        Good Pastures disease   Lung cancer Father        passed 34   Healthy Brother    Healthy Sister    Healthy Son    Healthy Daughter    Colon cancer Neg Hx    Rectal cancer Neg Hx    Stomach cancer Neg Hx     ALLERGIES:  has no known allergies.  MEDICATIONS:  Current Outpatient Medications  Medication Sig Dispense Refill   allopurinol (ZYLOPRIM) 300 MG tablet TAKE 1 TABLET(300 MG) BY MOUTH DAILY 90 tablet 0   amLODipine (NORVASC) 5 MG tablet TAKE 1 TABLET(5 MG) BY MOUTH DAILY 90 tablet 3   aspirin 81 MG tablet Take 81 mg by mouth daily.     fluorouracil (EFUDEX) 5 % cream Apply topically 2 (two) times daily. 80 g 0   hydroxyurea (HYDREA) 500 MG capsule TAKE 1 CAPSULE BY MOUTH IN THE MORNING, AT NOON, AND BEDTIME. MAY TAKE WITH FOOD TO MINIMIZE GI SIDE EFFECTS 90 capsule 4   metoprolol succinate (TOPROL-XL) 100 MG 24 hr tablet TAKE 1 TABLET(100 MG) BY MOUTH DAILY WITH OR IMMEDIATELY FOLLOWING A MEAL 90 tablet 3   No current facility-administered medications for this visit.    PHYSICAL EXAMINATION: ECOG PERFORMANCE STATUS: 0 - Asymptomatic  Vitals:   09/20/23 1342  BP: (!) 169/96  Pulse: (!) 106  Resp: 16  Temp: 98.6 F (37 C)   SpO2: 97%   Filed Weights   09/20/23 1342  Weight: 245 lb 12.8 oz (111.5 kg)   Physical Exam Constitutional:      Appearance: Normal appearance.  HENT:     Head: Normocephalic and atraumatic.  Cardiovascular:     Rate and Rhythm: Normal rate and regular rhythm.     Pulses: Normal pulses.     Heart sounds: Normal heart sounds.  Pulmonary:     Effort: Pulmonary effort is normal.     Breath sounds: Normal breath sounds.  Abdominal:     General: Abdomen is flat. Bowel sounds  are normal.     Palpations: Abdomen is soft.  Musculoskeletal:        General: No swelling, tenderness or deformity.     Cervical back: Normal range of motion.  Skin:    General: Skin is warm and dry.  Neurological:     General: No focal deficit present.     Mental Status: He is alert.  Psychiatric:        Mood and Affect: Mood normal.        Behavior: Behavior normal.     LABORATORY DATA:  I have reviewed the data as listed Lab Results  Component Value Date   WBC 7.6 09/20/2023   HGB 14.3 09/20/2023   HCT 40.6 09/20/2023   MCV 101.0 (H) 09/20/2023   PLT 218 09/20/2023     Chemistry      Component Value Date/Time   NA 140 09/20/2023 1218   NA 139 06/14/2018 0000   K 4.4 09/20/2023 1218   CL 108 09/20/2023 1218   CO2 28 09/20/2023 1218   BUN 19 09/20/2023 1218   BUN 21 06/14/2018 0000   CREATININE 1.67 (H) 09/20/2023 1218   GLU 98 06/14/2018 0000      Component Value Date/Time   CALCIUM 9.4 09/20/2023 1218   ALKPHOS 52 09/20/2023 1218   AST 14 (L) 09/20/2023 1218   ALT 7 09/20/2023 1218   BILITOT 1.2 (H) 09/20/2023 1218      CBC from today shows white blood cell count of 6.8 thousand, hemoglobin of 14.4, microcytosis consistent with Hydrea use and platelet count of 249,000. CMP from today shows creatinine of 1.71 which is his baseline.  RADIOGRAPHIC STUDIES: I have personally reviewed the radiological images as listed and agreed with the findings in the report. I spent 20  minutes in the care of this patient including history, review of records, counseling and coordination of care All questions were answered. The patient knows to call the clinic with any problems, questions or concerns.    Rachel Moulds, MD 09/20/2023 1:48 PM

## 2023-09-29 ENCOUNTER — Other Ambulatory Visit: Payer: Self-pay | Admitting: Internal Medicine

## 2023-11-22 ENCOUNTER — Other Ambulatory Visit: Payer: Self-pay | Admitting: Internal Medicine

## 2023-12-29 ENCOUNTER — Other Ambulatory Visit: Payer: Self-pay | Admitting: Internal Medicine

## 2024-02-06 ENCOUNTER — Other Ambulatory Visit: Payer: Self-pay | Admitting: Internal Medicine

## 2024-02-22 ENCOUNTER — Other Ambulatory Visit: Payer: Self-pay | Admitting: Internal Medicine

## 2024-03-19 ENCOUNTER — Telehealth: Payer: Self-pay

## 2024-03-19 ENCOUNTER — Other Ambulatory Visit: Payer: Self-pay | Admitting: *Deleted

## 2024-03-19 DIAGNOSIS — D473 Essential (hemorrhagic) thrombocythemia: Secondary | ICD-10-CM

## 2024-03-19 DIAGNOSIS — D75839 Thrombocytosis, unspecified: Secondary | ICD-10-CM

## 2024-03-19 NOTE — Telephone Encounter (Signed)
 Verbally confirmed appt for 6/17

## 2024-03-20 ENCOUNTER — Inpatient Hospital Stay: Payer: Medicare HMO | Attending: Hematology and Oncology

## 2024-03-20 ENCOUNTER — Inpatient Hospital Stay (HOSPITAL_BASED_OUTPATIENT_CLINIC_OR_DEPARTMENT_OTHER): Payer: Medicare HMO | Admitting: Hematology and Oncology

## 2024-03-20 ENCOUNTER — Encounter: Payer: Self-pay | Admitting: Hematology and Oncology

## 2024-03-20 VITALS — BP 160/78 | HR 100 | Temp 99.4°F | Resp 17 | Wt 237.9 lb

## 2024-03-20 DIAGNOSIS — N189 Chronic kidney disease, unspecified: Secondary | ICD-10-CM | POA: Diagnosis not present

## 2024-03-20 DIAGNOSIS — I129 Hypertensive chronic kidney disease with stage 1 through stage 4 chronic kidney disease, or unspecified chronic kidney disease: Secondary | ICD-10-CM | POA: Diagnosis not present

## 2024-03-20 DIAGNOSIS — Z7964 Long term (current) use of myelosuppressive agent: Secondary | ICD-10-CM | POA: Diagnosis not present

## 2024-03-20 DIAGNOSIS — Z801 Family history of malignant neoplasm of trachea, bronchus and lung: Secondary | ICD-10-CM | POA: Diagnosis not present

## 2024-03-20 DIAGNOSIS — D473 Essential (hemorrhagic) thrombocythemia: Secondary | ICD-10-CM

## 2024-03-20 DIAGNOSIS — R634 Abnormal weight loss: Secondary | ICD-10-CM | POA: Diagnosis not present

## 2024-03-20 DIAGNOSIS — Z79899 Other long term (current) drug therapy: Secondary | ICD-10-CM | POA: Insufficient documentation

## 2024-03-20 DIAGNOSIS — D75839 Thrombocytosis, unspecified: Secondary | ICD-10-CM | POA: Insufficient documentation

## 2024-03-20 DIAGNOSIS — Z87891 Personal history of nicotine dependence: Secondary | ICD-10-CM | POA: Diagnosis not present

## 2024-03-20 LAB — CBC WITH DIFFERENTIAL (CANCER CENTER ONLY)
Abs Immature Granulocytes: 0.02 10*3/uL (ref 0.00–0.07)
Basophils Absolute: 0 10*3/uL (ref 0.0–0.1)
Basophils Relative: 1 %
Eosinophils Absolute: 0.1 10*3/uL (ref 0.0–0.5)
Eosinophils Relative: 1 %
HCT: 40.5 % (ref 39.0–52.0)
Hemoglobin: 14 g/dL (ref 13.0–17.0)
Immature Granulocytes: 0 %
Lymphocytes Relative: 23 %
Lymphs Abs: 1.8 10*3/uL (ref 0.7–4.0)
MCH: 33.5 pg (ref 26.0–34.0)
MCHC: 34.6 g/dL (ref 30.0–36.0)
MCV: 96.9 fL (ref 80.0–100.0)
Monocytes Absolute: 0.7 10*3/uL (ref 0.1–1.0)
Monocytes Relative: 9 %
Neutro Abs: 5.3 10*3/uL (ref 1.7–7.7)
Neutrophils Relative %: 66 %
Platelet Count: 339 10*3/uL (ref 150–400)
RBC: 4.18 MIL/uL — ABNORMAL LOW (ref 4.22–5.81)
RDW: 14.6 % (ref 11.5–15.5)
WBC Count: 7.9 10*3/uL (ref 4.0–10.5)
nRBC: 0 % (ref 0.0–0.2)

## 2024-03-20 LAB — CMP (CANCER CENTER ONLY)
ALT: 8 U/L (ref 0–44)
AST: 15 U/L (ref 15–41)
Albumin: 4.1 g/dL (ref 3.5–5.0)
Alkaline Phosphatase: 56 U/L (ref 38–126)
Anion gap: 4 — ABNORMAL LOW (ref 5–15)
BUN: 21 mg/dL (ref 8–23)
CO2: 27 mmol/L (ref 22–32)
Calcium: 9.1 mg/dL (ref 8.9–10.3)
Chloride: 110 mmol/L (ref 98–111)
Creatinine: 1.94 mg/dL — ABNORMAL HIGH (ref 0.61–1.24)
GFR, Estimated: 37 mL/min — ABNORMAL LOW (ref >60)
Glucose, Bld: 85 mg/dL (ref 70–99)
Potassium: 4.2 mmol/L (ref 3.5–5.1)
Sodium: 141 mmol/L (ref 135–145)
Total Bilirubin: 1 mg/dL (ref 0.0–1.2)
Total Protein: 7.1 g/dL (ref 6.5–8.1)

## 2024-03-20 NOTE — Progress Notes (Signed)
 [facility] FOLLOW UP NOTE  Patient Care Team: Adelia Homestead, MD as PCP - General (Internal Medicine)  CHIEF COMPLAINTS/PURPOSE OF CONSULTATION:  ET follow up   ASSESSMENT & PLAN:   This is a very pleasant 68 year old male patient with past medical history significant for hypertension referred to hematology for evaluation of persistent and extreme thrombocytosis, CALR mutated.  ET, CALR mutated. Stable on Hydroxyurea  with good blood counts. No new side effects reported. -Continue Hydroxyurea  3 times daily. -Refill Hydroxyurea  prescription.  Chronic Kidney Disease Stable, no significant change from last year. -Continue current management.  Weight Loss Intentional, due to dietary changes and increased activity. -Monitor weight and continue current lifestyle modifications.  Follow-up in 6 months with labs.  Thank you for consulting us  in the care of this patient.  Please do not hesitate to contact us  with any additional questions or concerns.  HISTORY OF PRESENTING ILLNESS:   Howard Green 68 y.o. male is here because of ET   Oncology History   No history exists.   INTERIM HISTORY  Discussed the use of AI scribe software for clinical note transcription with the patient, who gave verbal consent to proceed.  History of Present Illness        The patient, with a history of CALR mutated ET, hypertension and kidney disease, presents for a routine follow-up. He reports adherence to his medication regimen, including hydrea  and aspirin, but occasionally misses doses due to a busy schedule. He has experienced unintentional weight loss, which he attributes to a busy summer and dietary changes, including reduced pasta intake. He denies any new side effects from his medications, but notes that he tries to eat something when taking his medications to prevent upset stomach. He also reports no new symptoms or changes in his health status. Rest of the pertinent 10  point ROS reviewed and neg.  MEDICAL HISTORY:  Past Medical History:  Diagnosis Date  . Allergy    seasonal like hayfever   . Bleeding of eye, bilateral   . Hypertension   . Thyroid  disease    benign cyst removed 15 years ago    SURGICAL HISTORY: Past Surgical History:  Procedure Laterality Date  . THYROID  SURGERY      SOCIAL HISTORY: Social History   Socioeconomic History  . Marital status: Widowed    Spouse name: Not on file  . Number of children: 4  . Years of education: Not on file  . Highest education level: Not on file  Occupational History  . Occupation: Run a Engineer, manufacturing riding school    Comment: Him and his daughter  Tobacco Use  . Smoking status: Never  . Smokeless tobacco: Never  Vaping Use  . Vaping status: Former  Substance and Sexual Activity  . Alcohol use: No    Alcohol/week: 0.0 standard drinks of alcohol  . Drug use: No  . Sexual activity: Not on file  Other Topics Concern  . Not on file  Social History Narrative   Lives with daughter.  Has 4 children.     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      Social Drivers of Corporate investment banker Strain: Low Risk  (06/21/2023)   Overall Financial Resource Strain (CARDIA)   . Difficulty of Paying Living Expenses: Not hard at all  Food Insecurity: No Food Insecurity (06/21/2023)   Hunger Vital Sign   . Worried About Programme researcher, broadcasting/film/video in the Last Year: Never  true   . Ran Out of Food in the Last Year: Never true  Transportation Needs: No Transportation Needs (06/21/2023)   PRAPARE - Transportation   . Lack of Transportation (Medical): No   . Lack of Transportation (Non-Medical): No  Physical Activity: Sufficiently Active (06/21/2023)   Exercise Vital Sign   . Days of Exercise per Week: 7 days   . Minutes of Exercise per Session: 60 min  Stress: No Stress Concern Present (06/21/2023)   [employer] of Occupational Health - Occupational Stress Questionnaire   . Feeling of Stress  : Not at all  Social Connections: Socially Isolated (06/21/2023)   Social Connection and Isolation Panel   . Frequency of Communication with Friends and Family: More than three times a week   . Frequency of Social Gatherings with Friends and Family: More than three times a week   . Attends Religious Services: Never   . Active Member of Clubs or Organizations: No   . Attends Banker Meetings: Never   . Marital Status: Widowed  Intimate Partner Violence: Not At Risk (06/21/2023)   Humiliation, Afraid, Rape, and Kick questionnaire   . Fear of Current or Ex-Partner: No   . Emotionally Abused: No   . Physically Abused: No   . Sexually Abused: No    FAMILY HISTORY: Family History  Problem Relation Age of Onset  . Diabetes Mother   . Other Mother        Good Pastures disease  . Lung cancer Father        passed 63  . Healthy Brother   . Healthy Sister   . Healthy Son   . Healthy Daughter   . Colon cancer Neg Hx   . Rectal cancer Neg Hx   . Stomach cancer Neg Hx     ALLERGIES:  has no known allergies.  MEDICATIONS:  Current Outpatient Medications  Medication Sig Dispense Refill  . allopurinol  (ZYLOPRIM ) 300 MG tablet TAKE 1 TABLET(300 MG) BY MOUTH DAILY 90 tablet 0  . amLODipine  (NORVASC ) 5 MG tablet TAKE 1 TABLET(5 MG) BY MOUTH DAILY 90 tablet 0  . aspirin 81 MG tablet Take 81 mg by mouth daily.    . fluorouracil  (EFUDEX ) 5 % cream Apply topically 2 (two) times daily. 80 g 0  . hydroxyurea  (HYDREA ) 500 MG capsule Take 1 capsule (500 mg total) by mouth 3 (three) times daily. May take with food to minimize GI side effects. 90 capsule 4  . metoprolol  succinate (TOPROL -XL) 100 MG 24 hr tablet TAKE 1 TABLET(100 MG) BY MOUTH DAILY WITH OR IMMEDIATELY FOLLOWING A MEAL 90 tablet 3   No current facility-administered medications for this visit.    PHYSICAL EXAMINATION: ECOG PERFORMANCE STATUS: 0 - Asymptomatic  There were no vitals filed for this visit.  There were no  vitals filed for this visit.  Physical Exam Constitutional:      Appearance: Normal appearance.  HENT:     Head: Normocephalic and atraumatic.   Cardiovascular:     Rate and Rhythm: Normal rate and regular rhythm.     Pulses: Normal pulses.     Heart sounds: Normal heart sounds.  Pulmonary:     Effort: Pulmonary effort is normal.     Breath sounds: Normal breath sounds.  Abdominal:     General: Abdomen is flat. Bowel sounds are normal.     Palpations: Abdomen is soft.   Musculoskeletal:        General: No swelling, tenderness or  deformity.     Cervical back: Normal range of motion.   Skin:    General: Skin is warm and dry.   Neurological:     General: No focal deficit present.     Mental Status: He is alert.   Psychiatric:        Mood and Affect: Mood normal.        Behavior: Behavior normal.    LABORATORY DATA:  I have reviewed the data as listed Lab Results  Component Value Date   WBC 7.9 03/20/2024   HGB 14.0 03/20/2024   HCT 40.5 03/20/2024   MCV 96.9 03/20/2024   PLT 339 03/20/2024     Chemistry      Component Value Date/Time   NA 140 09/20/2023 1218   NA 139 06/14/2018 0000   K 4.4 09/20/2023 1218   CL 108 09/20/2023 1218   CO2 28 09/20/2023 1218   BUN 19 09/20/2023 1218   BUN 21 06/14/2018 0000   CREATININE 1.67 (H) 09/20/2023 1218   GLU 98 06/14/2018 0000      Component Value Date/Time   CALCIUM 9.4 09/20/2023 1218   ALKPHOS 52 09/20/2023 1218   AST 14 (L) 09/20/2023 1218   ALT 7 09/20/2023 1218   BILITOT 1.2 (H) 09/20/2023 1218      CBC from today shows white blood cell count of 6.8 thousand, hemoglobin of 14.4, microcytosis consistent with Hydrea  use and platelet count of 249,000. CMP from today shows creatinine of 1.71 which is his baseline.  RADIOGRAPHIC STUDIES: I have personally reviewed the radiological images as listed and agreed with the findings in the report. I spent 20 minutes in the care of this patient including history,  review of records, counseling and coordination of care All questions were answered. The patient knows to call the clinic with any problems, questions or concerns.    Murleen Arms, MD 03/20/2024 1:44 PM

## 2024-03-20 NOTE — Progress Notes (Signed)
 [facility] FOLLOW UP NOTE  Patient Care Team: Adelia Homestead, MD as PCP - General (Internal Medicine)  CHIEF COMPLAINTS/PURPOSE OF CONSULTATION:  ET follow up   ASSESSMENT & PLAN:   This is a very pleasant 68 year old male patient with past medical history significant for hypertension referred to hematology for evaluation of persistent and extreme thrombocytosis, CALR mutated.  ET, CALR mutated. Assessment and Plan Assessment & Plan Essential thrombocythemia Chronic condition with well-managed platelet counts. Current treatment with Hydrea  (hydroxyurea ) at a reduced dose of two capsules per day is effective. He adheres to a baby aspirin regimen to mitigate thrombosis risk. - Continue Hydrea  (hydroxyurea ) at two capsules per day. - Continue baby aspirin as prescribed. - Educated on medication adherence and thrombosis risk.  Hypertension Blood pressure readings are elevated, ranging from 160-170 mmHg systolic at home. Current management includes amlodipine  5 mg daily. Consultation with primary care provider (Dr. Nicolette Barrio) is necessary for titration of medication - Discuss blood pressure management with primary care provider (Dr. Nicolette Barrio).  CKD, slow progression noted, likely from uncontrolled HTN Will try to contact Dr Nicolette Barrio.  Follow-up in 4 months with labs.  Thank you for consulting us  in the care of this patient.  Please do not hesitate to contact us  with any additional questions or concerns.  HISTORY OF PRESENTING ILLNESS:   Howard Green 68 y.o. male is here because of ET   Oncology History   No history exists.   INTERIM HISTORY  Discussed the use of AI scribe software for clinical note transcription with the patient, who gave verbal consent to proceed.  History of Present Illness        The patient, with a history of CALR mutated ET, hypertension and kidney disease, presents for a routine follow-up.   He has been taking Hydrea  twice a  day on most of the days and aspirin daily.  He tells me that he cannot keep up with the dose of Hydrea  because of very busy for showing season.  He denies any fevers, drenching night sweats, loss of appetite or loss of weight.  Hypertension remains uncontrolled, he is yet to follow-up with Dr. Nicolette Barrio, takes amlodipine  and metoprolol  as prescribed.  No other change in breathing, bowel habits or urinary habits. Rest of the pertinent 10 point ROS reviewed and neg.  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  Has  4 children.     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      Social Drivers of Corporate investment banker Strain: Low Risk  (06/21/2023)   Overall Financial Resource Strain (CARDIA)    Difficulty of Paying Living Expenses: Not hard at all  Food Insecurity: No Food Insecurity (06/21/2023)   Hunger Vital Sign    Worried About Running Out of Food in the Last Year: Never true    Ran Out of Food in the Last Year: Never true  Transportation Needs: No Transportation Needs (06/21/2023)    PRAPARE - Administrator, Civil Service (Medical): No    Lack of Transportation (Non-Medical): No  Physical Activity: Sufficiently Active (06/21/2023)   Exercise Vital Sign    Days of Exercise per Week: 7 days    Minutes of Exercise per Session: 60 min  Stress: No Stress Concern Present (06/21/2023)   [employer] of Occupational Health - Occupational Stress Questionnaire    Feeling of Stress : Not at all  Social Connections: Socially Isolated (06/21/2023)   Social Connection and Isolation Panel    Frequency of Communication with Friends and Family: More than three times a week    Frequency of Social Gatherings with Friends and Family: More than three times a week    Attends Religious Services: Never    Database administrator or Organizations: No    Attends Banker Meetings: Never    Marital Status: Widowed  Intimate Partner Violence: Not At Risk (06/21/2023)   Humiliation, Afraid, Rape, and Kick questionnaire    Fear of Current or Ex-Partner: No    Emotionally Abused: No    Physically Abused: No    Sexually Abused: No    FAMILY HISTORY: Family History  Problem Relation Age of Onset   Diabetes Mother    Other Mother        Good Pastures disease   Lung cancer Father        passed 33   Healthy Brother    Healthy Sister    Healthy Son    Healthy Daughter    Colon cancer Neg Hx    Rectal cancer Neg Hx    Stomach cancer Neg Hx     ALLERGIES:  has no known allergies.  MEDICATIONS:  Current Outpatient Medications  Medication Sig Dispense Refill   allopurinol  (ZYLOPRIM ) 300 MG tablet TAKE 1 TABLET(300 MG) BY MOUTH DAILY 90 tablet 0   amLODipine  (NORVASC ) 5 MG tablet TAKE 1 TABLET(5 MG) BY MOUTH DAILY 90 tablet 0   aspirin 81 MG tablet Take 81 mg by mouth daily.     fluorouracil  (EFUDEX ) 5 % cream Apply topically 2 (two) times daily. 80 g 0   hydroxyurea  (HYDREA ) 500 MG capsule Take 1 capsule (500 mg total) by mouth 3 (three) times daily. May take  with food to minimize GI side effects. 90 capsule 4   metoprolol  succinate (TOPROL -XL) 100 MG 24 hr tablet TAKE 1 TABLET(100 MG) BY MOUTH DAILY WITH OR IMMEDIATELY FOLLOWING A MEAL 90 tablet 3   No current facility-administered medications for this visit.    PHYSICAL EXAMINATION: ECOG PERFORMANCE STATUS: 0 - Asymptomatic  Vitals:   03/20/24 1344 03/20/24 1345  BP: (!) 171/87 (!) 160/78  Pulse: 100   Resp: 17   Temp: 99.4 F (37.4 C)   SpO2: 100%    Filed Weights   03/20/24 1344  Weight: 237 lb 14.4 oz (107.9 kg)  Physical Exam Constitutional:      Appearance: Normal appearance.  HENT:     Head: Normocephalic and atraumatic.   Cardiovascular:     Rate and Rhythm: Normal rate and regular rhythm.     Pulses: Normal pulses.     Heart sounds: Normal heart sounds.  Pulmonary:     Effort: Pulmonary effort is normal.     Breath sounds: Normal breath sounds.  Abdominal:     General: Abdomen is flat. Bowel sounds are normal.     Palpations: Abdomen is soft.   Musculoskeletal:        General: No swelling, tenderness or deformity.     Cervical back: Normal range of motion.   Skin:    General: Skin is warm and dry.   Neurological:     General: No focal deficit present.     Mental Status: He is alert.   Psychiatric:        Mood and Affect: Mood normal.        Behavior: Behavior normal.     LABORATORY DATA:  I have reviewed the data as listed Lab Results  Component Value Date   WBC 7.9 03/20/2024   HGB 14.0 03/20/2024   HCT 40.5 03/20/2024   MCV 96.9 03/20/2024   PLT 339 03/20/2024     Chemistry      Component Value Date/Time   NA 140 09/20/2023 1218   NA 139 06/14/2018 0000   K 4.4 09/20/2023 1218   CL 108 09/20/2023 1218   CO2 28 09/20/2023 1218   BUN 19 09/20/2023 1218   BUN 21 06/14/2018 0000   CREATININE 1.67 (H) 09/20/2023 1218   GLU 98 06/14/2018 0000      Component Value Date/Time   CALCIUM 9.4 09/20/2023 1218   ALKPHOS 52 09/20/2023 1218    AST 14 (L) 09/20/2023 1218   ALT 7 09/20/2023 1218   BILITOT 1.2 (H) 09/20/2023 1218      Reviewed labs from today, no polycythemia or thrombocytosis noted. RADIOGRAPHIC STUDIES: I have personally reviewed the radiological images as listed and agreed with the findings in the report. I spent 20 minutes in the care of this patient including history, review of records, counseling and coordination of care  All questions were answered. The patient knows to call the clinic with any problems, questions or concerns.    Murleen Arms, MD 03/20/2024 1:45 PM

## 2024-03-21 ENCOUNTER — Telehealth: Payer: Self-pay

## 2024-03-21 NOTE — Telephone Encounter (Signed)
-----   Message from Rondall Codding sent at 03/20/2024 10:10 PM EDT ----- Please be sure this patient is scheduled for a blood pressure check soon. ----- Message ----- From: Murleen Arms, MD Sent: 03/20/2024   5:08 PM EDT To: Rondall Codding, FNP; Adelia Homestead, MD  Hello Dr Nicolette Barrio  This is a mutual pt we see for ET on Hydrea  and aspirin. He continues to have poorly controlled HTN and his CKD appears to worsening. I have spoken to him on several occasions to get in touch with you for titration of BP meds, I was wondering if ur office can call him and see him for FU to discuss this further. We appreciate your help in this matter.

## 2024-03-21 NOTE — Telephone Encounter (Signed)
I have scheduled the patient

## 2024-03-27 ENCOUNTER — Other Ambulatory Visit: Payer: Self-pay | Admitting: Internal Medicine

## 2024-04-10 ENCOUNTER — Encounter: Payer: Self-pay | Admitting: Internal Medicine

## 2024-04-10 ENCOUNTER — Ambulatory Visit (INDEPENDENT_AMBULATORY_CARE_PROVIDER_SITE_OTHER): Admitting: Internal Medicine

## 2024-04-10 VITALS — BP 152/82 | HR 85 | Temp 98.6°F | Ht 72.0 in | Wt 241.0 lb

## 2024-04-10 DIAGNOSIS — I10 Essential (primary) hypertension: Secondary | ICD-10-CM | POA: Diagnosis not present

## 2024-04-10 DIAGNOSIS — Z13220 Encounter for screening for lipoid disorders: Secondary | ICD-10-CM | POA: Diagnosis not present

## 2024-04-10 DIAGNOSIS — M1A3710 Chronic gout due to renal impairment, right ankle and foot, without tophus (tophi): Secondary | ICD-10-CM

## 2024-04-10 DIAGNOSIS — N1831 Chronic kidney disease, stage 3a: Secondary | ICD-10-CM

## 2024-04-10 DIAGNOSIS — I4819 Other persistent atrial fibrillation: Secondary | ICD-10-CM | POA: Diagnosis not present

## 2024-04-10 DIAGNOSIS — Z0000 Encounter for general adult medical examination without abnormal findings: Secondary | ICD-10-CM | POA: Diagnosis not present

## 2024-04-10 LAB — COMPREHENSIVE METABOLIC PANEL WITH GFR
ALT: 12 U/L (ref 0–53)
AST: 19 U/L (ref 0–37)
Albumin: 4 g/dL (ref 3.5–5.2)
Alkaline Phosphatase: 57 U/L (ref 39–117)
BUN: 19 mg/dL (ref 6–23)
CO2: 25 meq/L (ref 19–32)
Calcium: 9.1 mg/dL (ref 8.4–10.5)
Chloride: 107 meq/L (ref 96–112)
Creatinine, Ser: 1.9 mg/dL — ABNORMAL HIGH (ref 0.40–1.50)
GFR: 35.69 mL/min — ABNORMAL LOW (ref >60.00)
Glucose, Bld: 93 mg/dL (ref 70–99)
Potassium: 4.4 meq/L (ref 3.5–5.1)
Sodium: 139 meq/L (ref 135–145)
Total Bilirubin: 0.8 mg/dL (ref 0.2–1.2)
Total Protein: 7.1 g/dL (ref 6.0–8.3)

## 2024-04-10 LAB — LIPID PANEL
Cholesterol: 150 mg/dL (ref 0–200)
HDL: 37.2 mg/dL — ABNORMAL LOW (ref >39.00)
LDL Cholesterol: 89 mg/dL (ref 0–99)
NonHDL: 112.87
Total CHOL/HDL Ratio: 4
Triglycerides: 121 mg/dL (ref 0.0–149.0)
VLDL: 24.2 mg/dL (ref 0.0–40.0)

## 2024-04-10 LAB — URIC ACID: Uric Acid, Serum: 5.8 mg/dL (ref 4.0–7.8)

## 2024-04-10 MED ORDER — AMLODIPINE BESYLATE 10 MG PO TABS: 10.0000 mg | ORAL_TABLET | Freq: Every day | ORAL | 3 refills | Status: AC

## 2024-04-10 NOTE — Progress Notes (Signed)
   Subjective:   Patient ID: Howard Green, male    DOB: [birth year], 68 y.o.   MRN: [id number]  HPI The patient is here for physical.  PMH, [facility], social history reviewed and updated  Review of Systems  Constitutional: Negative.   HENT: Negative.    Eyes: Negative.   Respiratory:  Negative for cough, chest tightness and shortness of breath.   Cardiovascular:  Negative for chest pain, palpitations and leg swelling.  Gastrointestinal:  Negative for abdominal distention, abdominal pain, constipation, diarrhea, nausea and vomiting.  Musculoskeletal: Negative.   Skin: Negative.   Neurological: Negative.   Psychiatric/Behavioral: Negative.      Objective:  Physical Exam Constitutional:      Appearance: He is well-developed.  HENT:     Head: Normocephalic and atraumatic.  Cardiovascular:     Rate and Rhythm: Normal rate and regular rhythm.  Pulmonary:     Effort: Pulmonary effort is normal. No respiratory distress.     Breath sounds: Normal breath sounds. No wheezing or rales.  Abdominal:     General: Bowel sounds are normal. There is no distension.     Palpations: Abdomen is soft.     Tenderness: There is no abdominal tenderness. There is no rebound.  Musculoskeletal:     Cervical back: Normal range of motion.  Skin:    General: Skin is warm and dry.  Neurological:     Mental Status: He is alert and oriented to person, place, and time.     Coordination: Coordination normal.     Vitals:   04/10/24 1414 04/10/24 1418 04/10/24 1439  BP: (!) 156/92 (!) 158/94 (!) 152/82  Pulse: 85    Temp: 98.6 F (37 C)    TempSrc: Oral    SpO2: 95%    Weight: 241 lb (109.3 kg)    Height: 6' (1.829 m)      Assessment & Plan:

## 2024-04-13 NOTE — Assessment & Plan Note (Signed)
 No flare checking uric acid and adjust allopurinol  300 mg daily as needed.

## 2024-04-13 NOTE — Assessment & Plan Note (Signed)
 Checking CMP for stability. His BP has been elevated and can be causing progression.

## 2024-04-13 NOTE — Assessment & Plan Note (Signed)
 Flu shot counseled. Pneumonia declines. Shingrix declines. Tetanus declines. Colonoscopy declines. Counseled about sun safety and mole surveillance. Counseled about the dangers of distracted driving. Given 10 year screening recommendations.

## 2024-04-13 NOTE — Assessment & Plan Note (Signed)
 Increasing amlodipine  to 10 mg daily and keep metoprolol  100 mg daily. Checking CMP and adjust as needed.

## 2024-04-13 NOTE — Assessment & Plan Note (Signed)
 Is rate controlled with metoprolol  and sounds regular but has been irregular at times. Due to his work he has declined blood thinners in the past and we have discussed stroke risk.

## 2024-04-16 ENCOUNTER — Ambulatory Visit: Payer: Self-pay | Admitting: Internal Medicine

## 2024-04-16 ENCOUNTER — Telehealth: Payer: Self-pay

## 2024-04-16 DIAGNOSIS — N1832 Chronic kidney disease, stage 3b: Secondary | ICD-10-CM

## 2024-04-16 NOTE — Telephone Encounter (Signed)
 Copied from CRM [id number]. Topic: Clinical - Lab/Test Results >> Apr 16, 2024  1:28 PM Henretta I wrote: Reason for CRM: Relayed lab follow up notes to patient and he stated he is willing to see a kidney doctor.

## 2024-04-17 NOTE — Telephone Encounter (Signed)
 Called patient and informed him and he verbalized he understood

## 2024-04-17 NOTE — Telephone Encounter (Signed)
 Referral done

## 2024-05-02 DIAGNOSIS — N1832 Chronic kidney disease, stage 3b: Secondary | ICD-10-CM | POA: Diagnosis not present

## 2024-05-02 DIAGNOSIS — I129 Hypertensive chronic kidney disease with stage 1 through stage 4 chronic kidney disease, or unspecified chronic kidney disease: Secondary | ICD-10-CM | POA: Diagnosis not present

## 2024-05-02 DIAGNOSIS — E669 Obesity, unspecified: Secondary | ICD-10-CM | POA: Diagnosis not present

## 2024-06-12 ENCOUNTER — Ambulatory Visit (INDEPENDENT_AMBULATORY_CARE_PROVIDER_SITE_OTHER)

## 2024-06-12 VITALS — Ht 72.0 in | Wt 241.0 lb

## 2024-06-12 DIAGNOSIS — Z0000 Encounter for general adult medical examination without abnormal findings: Secondary | ICD-10-CM

## 2024-06-12 NOTE — Progress Notes (Signed)
 Subjective:   Howard Green is a 68 y.o. who presents for a Medicare Wellness preventive visit.  As a reminder, Annual Wellness Visits don't include a physical exam, and some assessments may be limited, especially if this visit is performed virtually. We may recommend an in-person follow-up visit with your provider if needed.  Visit Complete: Virtual I connected with  Howard Green on 06/12/24 by a audio enabled telemedicine application and verified that I am speaking with the correct person using two identifiers.  Patient Location: Home  Provider Location: Home Office  I discussed the limitations of evaluation and management by telemedicine. The patient expressed understanding and agreed to proceed.  Vital Signs: Because this visit was a virtual/telehealth visit, some criteria may be missing or patient reported. Any vitals not documented were not able to be obtained and vitals that have been documented are patient reported.  VideoDeclined- This patient declined Librarian, academic. Therefore the visit was completed with audio only.  Persons Participating in Visit: Patient.  AWV Questionnaire: No: Patient Medicare AWV questionnaire was not completed prior to this visit.  Cardiac Risk Factors include: advanced age (>27men, >18 women);hypertension;male gender     Objective:    Today's Vitals   06/12/24 1315  Weight: 241 lb (109.3 kg)  Height: 6' (1.829 m)   Body mass index is 32.69 kg/m.     06/12/2024    1:21 PM 06/21/2023    1:52 PM 07/09/2022    9:50 AM 10/29/2020   12:38 PM 10/15/2020    3:07 PM 09/02/2019    8:54 AM 07/21/2018    4:28 PM  Advanced Directives  Does Patient Have a Medical Advance Directive? Yes Yes No No No No No   Type of Advance Directive Living will Healthcare Power of [place];Living will       Copy of Healthcare Power of Attorney in Chart?  No - copy requested       Would patient like information on creating a  medical advance directive?   No - Patient declined No - Patient declined No - Patient declined  No - Patient declined      Data saved with a previous flowsheet row definition    Current Medications (verified) Outpatient Encounter Medications as of 06/12/2024  Medication Sig   allopurinol  (ZYLOPRIM ) 300 MG tablet TAKE 1 TABLET(300 MG) BY MOUTH DAILY   amLODipine  (NORVASC ) 10 MG tablet Take 1 tablet (10 mg total) by mouth daily.   aspirin 81 MG tablet Take 81 mg by mouth daily.   fluorouracil  (EFUDEX ) 5 % cream Apply topically 2 (two) times daily.   hydroxyurea  (HYDREA ) 500 MG capsule Take 1 capsule (500 mg total) by mouth 3 (three) times daily. May take with food to minimize GI side effects.   metoprolol  succinate (TOPROL -XL) 100 MG 24 hr tablet TAKE 1 TABLET(100 MG) BY MOUTH DAILY WITH OR IMMEDIATELY FOLLOWING A MEAL   No facility-administered encounter medications on file as of 06/12/2024.    Allergies (verified) Patient has no known allergies.   History: Past Medical History:  Diagnosis Date   Allergy    seasonal like hayfever    Bleeding of eye, bilateral    Hypertension    Thyroid  disease    benign cyst removed 15 years ago   Past Surgical History:  Procedure Laterality Date   THYROID  SURGERY     Family History  Problem Relation Age of Onset   Diabetes Mother    Other Mother  Good Pastures disease   Lung cancer Father        passed 50   Healthy Brother    Healthy Sister    Healthy Son    Healthy Daughter    Colon cancer Neg Hx    Rectal cancer Neg Hx    Stomach cancer Neg Hx    Social History   Socioeconomic History   Marital status: Widowed    Spouse name: Not on file   Number of children: 4   Years of education: Not on file   Highest education level: Not on file  Occupational History   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      Social Drivers of Corporate investment banker Strain: Low Risk  (06/12/2024)   Overall Financial Resource Strain (CARDIA)    Difficulty of Paying Living Expenses: Not very hard  Food Insecurity: No Food Insecurity (06/12/2024)   Hunger Vital Sign    Worried About Running Out of Food in the Last Year: Never true    Ran Out of Food in the Last Year: Never true  Transportation Needs: No Transportation Needs (06/12/2024)   PRAPARE - Administrator, Civil Service (Medical): No    Lack of Transportation (Non-Medical): No  Physical Activity: Sufficiently Active (06/21/2023)   Exercise Vital Sign    Days of Exercise per Week: 7 days    Minutes of Exercise per Session: 60 min  Stress: No Stress Concern Present (06/12/2024)   [employer] of Occupational Health - Occupational Stress Questionnaire    Feeling of Stress: Not at all  Social Connections: Socially Isolated (06/12/2024)   Social Connection and Isolation Panel    Frequency of Communication with Friends and Family: More than three times a week    Frequency of Social Gatherings with Friends and Family: More than three times a week    Attends Religious Services: Never    Database administrator or Organizations: No    Attends Banker Meetings: Never    Marital Status: Widowed    Tobacco Counseling Counseling given: Not Answered    Clinical Intake:  Pre-visit preparation completed: Yes  Pain : No/denies pain     BMI - recorded: 32.69 Nutritional Status: BMI > 30  Obese Nutritional Risks: None Diabetes: No  Lab Results  Component Value Date   HGBA1C 5.6 09/19/2020    HGBA1C 5.5 01/14/2015     How often do you need to have someone help you when you read instructions, pamphlets, or other written materials from your doctor or pharmacy?: 1 - Never  Interpreter Needed?: No  Information entered by :: Markel Mergenthaler, RMA   Activities of Daily Living     06/12/2024    1:18 PM 06/21/2023    1:50 PM  In your present state of health, do you have any difficulty performing the following activities:  Hearing? 0 0  Vision? 0 0  Difficulty concentrating or making decisions? 0 0  Walking or climbing stairs? 0 0  Dressing or bathing? 0 0  Doing errands, shopping? 0 0  Preparing Food and eating ? N N  Using the Toilet? N N  In the past six months, have you accidently leaked urine? N N  Do you have problems with loss of bowel control? N N  Managing your Medications? N N  Managing your Finances? N N  Housekeeping or managing your Housekeeping? N N    Patient Care Team: Rollene Almarie LABOR, MD as PCP - General (Internal Medicine)  I have updated your Care Teams any recent Medical Services you may have received from other providers in the past year.     Assessment:   This is a routine wellness examination for [place].  Hearing/Vision screen Hearing Screening - Comments:: Denies hearing difficulties   Vision Screening - Comments:: Wears eyeglasses/[facility] [address]   Goals Addressed   None    Depression Screen     06/12/2024    1:23 PM 06/21/2023    1:59 PM 08/04/2022   10:54 AM 07/09/2022    9:58 AM 01/26/2022    1:37 PM 09/19/2020    2:16 PM 09/03/2019    2:57 PM  PHQ 2/9 Scores  PHQ - 2 Score 0 0 0 0 0 0 0  PHQ- 9 Score 0 0 0  0 0     Fall Risk     06/12/2024    1:21 PM 06/21/2023    1:53 PM 08/04/2022   10:55 AM 07/09/2022    9:50 AM 01/26/2022    1:37 PM  Fall Risk   Falls in the past year? 0 0 0 0 0  Number falls in past yr: 0 0 0 0 0  Injury with Fall? 0 0 0 0 0  Risk for fall due to :  No Fall Risks  No Fall Risks   Follow up  Falls evaluation completed;Falls prevention discussed Falls prevention discussed;Falls evaluation completed  Falls prevention discussed       Data saved with a previous flowsheet row definition    MEDICARE RISK AT HOME:  Medicare Risk at Home Any stairs in or around the home?: No If so, are there any without handrails?: No Home free of loose throw rugs in walkways, pet beds, electrical cords, etc?: Yes Adequate lighting in your home to reduce risk of falls?: Yes Life alert?: No Use of a cane, walker or w/c?: No Grab bars in the bathroom?: Yes Shower chair or bench in shower?: Yes Elevated toilet seat or a handicapped toilet?: Yes  TIMED UP AND GO:  Was the test performed?  No  Cognitive Function: Declined/Normal: No cognitive concerns noted by patient or family. Patient alert, oriented, able to answer questions appropriately and recall recent events. No signs of memory loss or confusion.        06/21/2023    1:56 PM 07/09/2022    9:59 AM  6CIT Screen  What Year? 0 points 0 points  What month? 0 points 0 points  What time? 0 points 0 points  Count back from 20 0 points 0 points  Months in reverse 0 points 0 points  Repeat phrase 0 points 0 points  Total Score 0 points 0 points    Immunizations Immunization History  Administered Date(s) Administered   PFIZER(Purple Top)SARS-COV-2 Vaccination 12/18/2019, 01/08/2020   Tdap 03/21/2015    Screening Tests Health Maintenance  Topic Date Due   Hepatitis  C Screening  Never done   Pneumococcal Vaccine: 50+ Years (1 of 2 - PCV) Never done   Zoster Vaccines- Shingrix (1 of 2) Never done   Colonoscopy  Never done   COVID-19 Vaccine (3 - Pfizer risk series) 02/05/2020   Influenza Vaccine  Never done   Medicare Annual Wellness (AWV)  06/20/2024   DTaP/Tdap/Td (2 - Td or Tdap) 03/20/2025   HPV VACCINES  Aged Out   Meningococcal B Vaccine  Aged Out    Health Maintenance Items Addressed: See Nurse Notes at the end of this  note  Additional Screening:  Vision Screening: Recommended annual ophthalmology exams for early detection of glaucoma and other disorders of the eye. Is the patient up to date with their annual eye exam?  No  Who is the provider or what is the name of the office in which the patient attends annual eye exams? [facility]  Dental Screening: Recommended annual dental exams for proper oral hygiene  Community Resource Referral / Chronic Care Management: CRR required this visit?  No   CCM required this visit?  No   Plan:    I have personally reviewed and noted the following in the patient's chart:   Medical and social history Use of alcohol, tobacco or illicit drugs  Current medications and supplements including opioid prescriptions. Patient is not currently taking opioid prescriptions. Functional ability and status Nutritional status Physical activity Advanced directives List of other physicians Hospitalizations, surgeries, and ER visits in previous 12 months Vitals Screenings to include cognitive, depression, and falls Referrals and appointments  In addition, I have reviewed and discussed with patient certain preventive protocols, quality metrics, and best practice recommendations. A written personalized care plan for preventive services as well as general preventive health recommendations were provided to patient.   Mj Willis L Angell Pincock, CMA   06/12/2024   After Visit Summary: (Mail) Due to this being a telephonic visit, the after visit summary with patients personalized plan was offered to patient via mail   Notes: Patient is due for a colonoscopy,however, he declines.  He also declines any due vaccines.  Patient had no other concerns to address today.

## 2024-06-12 NOTE — Patient Instructions (Signed)
 Mr. Howard Green,  Thank you for taking the time for your Medicare Wellness Visit. I appreciate your continued commitment to your health goals. Please review the care plan we discussed, and feel free to reach out if I can assist you further.  Medicare recommends these wellness visits once per year to help you and your care team stay ahead of potential health issues. These visits are designed to focus on prevention, allowing your provider to concentrate on managing your acute and chronic conditions during your regular appointments.  Please note that Annual Wellness Visits do not include a physical exam. Some assessments may be limited, especially if the visit was conducted virtually. If needed, we may recommend a separate in-person follow-up with your provider.  Ongoing Care Seeing your primary care provider every 3 to 6 months helps us  monitor your health and provide consistent, personalized care. Last office visit on 04/10/2024.   Referrals If a referral was made during today's visit and you haven't received any updates within two weeks, please contact the referred provider directly to check on the status.  Recommended Screenings:  Health Maintenance  Topic Date Due   Hepatitis C Screening  Never done   Pneumococcal Vaccine for age over 19 (1 of 2 - PCV) Never done   Zoster (Shingles) Vaccine (1 of 2) Never done   Colon Cancer Screening  Never done   COVID-19 Vaccine (3 - Pfizer risk series) 02/05/2020   Flu Shot  Never done   Medicare Annual Wellness Visit  06/20/2024   DTaP/Tdap/Td vaccine (2 - Td or Tdap) 03/20/2025   HPV Vaccine  Aged Out   Meningitis B Vaccine  Aged Out       06/12/2024    1:21 PM  Advanced Directives  Does Patient Have a Medical Advance Directive? Yes  Type of Advance Directive Living will   Advance Care Planning is important because it: Ensures you receive medical care that aligns with your values, goals, and preferences. Provides guidance to your family and  loved ones, reducing the emotional burden of decision-making during critical moments.  Vision: Annual vision screenings are recommended for early detection of glaucoma, cataracts, and diabetic retinopathy. These exams can also reveal signs of chronic conditions such as diabetes and high blood pressure.  Dental: Annual dental screenings help detect early signs of oral cancer, gum disease, and other conditions linked to overall health, including heart disease and diabetes.  Please see the attached documents for additional preventive care recommendations.

## 2024-06-13 ENCOUNTER — Other Ambulatory Visit: Payer: Self-pay | Admitting: Internal Medicine

## 2024-07-16 ENCOUNTER — Telehealth: Payer: Self-pay

## 2024-07-16 NOTE — Telephone Encounter (Signed)
 Spoke with patient and confirmed appointment on 10/13

## 2024-07-17 ENCOUNTER — Inpatient Hospital Stay: Admitting: Hematology and Oncology

## 2024-07-17 ENCOUNTER — Inpatient Hospital Stay: Attending: Hematology and Oncology

## 2024-07-17 VITALS — BP 159/83 | HR 95 | Temp 97.8°F | Resp 16 | Wt 240.7 lb

## 2024-07-17 DIAGNOSIS — D75839 Thrombocytosis, unspecified: Secondary | ICD-10-CM | POA: Diagnosis not present

## 2024-07-17 DIAGNOSIS — Z87891 Personal history of nicotine dependence: Secondary | ICD-10-CM | POA: Diagnosis not present

## 2024-07-17 DIAGNOSIS — Z801 Family history of malignant neoplasm of trachea, bronchus and lung: Secondary | ICD-10-CM | POA: Insufficient documentation

## 2024-07-17 DIAGNOSIS — Z79899 Other long term (current) drug therapy: Secondary | ICD-10-CM | POA: Insufficient documentation

## 2024-07-17 DIAGNOSIS — D473 Essential (hemorrhagic) thrombocythemia: Secondary | ICD-10-CM | POA: Insufficient documentation

## 2024-07-17 DIAGNOSIS — I4891 Unspecified atrial fibrillation: Secondary | ICD-10-CM | POA: Diagnosis not present

## 2024-07-17 DIAGNOSIS — I10 Essential (primary) hypertension: Secondary | ICD-10-CM | POA: Insufficient documentation

## 2024-07-17 DIAGNOSIS — Z7982 Long term (current) use of aspirin: Secondary | ICD-10-CM | POA: Diagnosis not present

## 2024-07-17 LAB — CBC WITH DIFFERENTIAL (CANCER CENTER ONLY)
Abs Immature Granulocytes: 0.04 K/uL (ref 0.00–0.07)
Basophils Absolute: 0.1 K/uL (ref 0.0–0.1)
Basophils Relative: 1 %
Eosinophils Absolute: 0.1 K/uL (ref 0.0–0.5)
Eosinophils Relative: 2 %
HCT: 42.4 % (ref 39.0–52.0)
Hemoglobin: 14.7 g/dL (ref 13.0–17.0)
Immature Granulocytes: 1 %
Lymphocytes Relative: 22 %
Lymphs Abs: 1.7 K/uL (ref 0.7–4.0)
MCH: 34 pg (ref 26.0–34.0)
MCHC: 34.7 g/dL (ref 30.0–36.0)
MCV: 98.1 fL (ref 80.0–100.0)
Monocytes Absolute: 0.7 K/uL (ref 0.1–1.0)
Monocytes Relative: 9 %
Neutro Abs: 5.1 K/uL (ref 1.7–7.7)
Neutrophils Relative %: 65 %
Platelet Count: 420 K/uL — ABNORMAL HIGH (ref 150–400)
RBC: 4.32 MIL/uL (ref 4.22–5.81)
RDW: 13.8 % (ref 11.5–15.5)
WBC Count: 7.7 K/uL (ref 4.0–10.5)
nRBC: 0 % (ref 0.0–0.2)

## 2024-07-17 LAB — CMP (CANCER CENTER ONLY)
ALT: 7 U/L (ref 0–44)
AST: 15 U/L (ref 15–41)
Albumin: 4.2 g/dL (ref 3.5–5.0)
Alkaline Phosphatase: 60 U/L (ref 38–126)
Anion gap: 5 (ref 5–15)
BUN: 18 mg/dL (ref 8–23)
CO2: 27 mmol/L (ref 22–32)
Calcium: 9.7 mg/dL (ref 8.9–10.3)
Chloride: 109 mmol/L (ref 98–111)
Creatinine: 1.62 mg/dL — ABNORMAL HIGH (ref 0.61–1.24)
GFR, Estimated: 46 mL/min — ABNORMAL LOW (ref >60)
Glucose, Bld: 103 mg/dL — ABNORMAL HIGH (ref 70–99)
Potassium: 4.3 mmol/L (ref 3.5–5.1)
Sodium: 141 mmol/L (ref 135–145)
Total Bilirubin: 0.8 mg/dL (ref 0.0–1.2)
Total Protein: 7.3 g/dL (ref 6.5–8.1)

## 2024-07-17 NOTE — Progress Notes (Signed)
 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  ET, CALR mutated  Assessment and Plan Assessment & Plan Essential thrombocythemia Platelet count increased to 420,000. Inconsistent Hydrea  adherence may contribute. - Ensure Hydrea  adherence twice daily. - No other concerns on history or exam today.  Atrial fibrillation Managed with metoprolol  and aspirin. Two thromboembolism risk factors present. - Continue metoprolol  and aspirin 81 mg daily. - Consult Dr. Almarie Rollene if there is any role for anticoagulation instead of aspirin.  Hypertension Blood pressure 159/83 mmHg, consistent with previous readings. - Continue amlodipine  10 mg daily.     HISTORY OF PRESENTING ILLNESS:   Howard Green 68 y.o. male is here because of ET   Oncology History   No history exists.   INTERIM HISTORY  History of Present Illness    The patient, with a history of CALR mutated ET, hypertension and kidney disease, presents for a routine follow-up  Discussed the use of AI scribe software for clinical note transcription with the patient, who gave verbal consent to proceed.  History of Present Illness Howard Green is a 68 year old male with thrombocytosis and atrial fibrillation who presents for follow-up of his blood condition.  He is being followed for thrombocytosis and is prescribed Hydrea , which he sometimes forgets to take twice daily as directed. He often takes both doses together at night but occasionally forgets. Recent blood work showed a platelet count of 420,000, slightly elevated from previous levels maintained under  400,000.  He has a history of atrial fibrillation and is currently taking metoprolol . He is under the care of Dr. Almarie Rollene, his cardiologist. He takes an 81 mg aspirin daily as a precaution against blood clots.  No fevers, drenching night sweats, or changes in appetite. His weight has remained stable around 237 pounds, with a slight increase during the summer months. No leg swelling and normal bowel and urinary function.  His blood pressure was recently recorded at 159/83, and he has been started on 10 mg of amlodipine . His blood pressure has remained about the same as previous readings.    Rest of the pertinent 10 point ROS reviewed and neg.  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      Social Drivers of Corporate investment banker Strain: Low Risk  (06/12/2024)   Overall Financial Resource Strain (CARDIA)    Difficulty of Paying Living Expenses: Not very hard  Food Insecurity: No Food Insecurity (06/12/2024)    Hunger Vital Sign    Worried About Running Out of Food in the Last Year: Never true    Ran Out of Food in the Last Year: Never true  Transportation Needs: No Transportation Needs (06/12/2024)   PRAPARE - Administrator, Civil Service (Medical): No    Lack of Transportation (Non-Medical): No  Physical Activity: Sufficiently Active (06/21/2023)   Exercise Vital Sign    Days of Exercise per Week: 7 days    Minutes of Exercise per Session: 60 min  Stress: No Stress Concern Present (06/12/2024)   [employer] of Occupational Health - Occupational Stress Questionnaire    Feeling of Stress: Not at all  Social Connections: Socially Isolated (06/12/2024)   Social Connection and Isolation Panel    Frequency of Communication with Friends and Family: More than three times a week    Frequency of Social Gatherings with Friends and Family: More than three times a week    Attends Religious Services: Never    Database administrator or Organizations: No    Attends Banker Meetings: Never    Marital Status: Widowed  Intimate Partner Violence: Patient Unable To Answer (06/12/2024)   Humiliation, Afraid, Rape, and Kick questionnaire    Fear of Current or Ex-Partner: Patient unable to answer    Emotionally Abused: Patient unable to answer    Physically Abused: Patient unable to answer    Sexually Abused: Patient unable to answer    FAMILY HISTORY: Family History  Problem Relation Age of Onset   Diabetes Mother    Other Mother        Good Pastures disease   Lung cancer Father        passed 25   Healthy Brother    Healthy Sister    Healthy Son    Healthy Daughter    Colon cancer Neg Hx    Rectal cancer Neg Hx    Stomach cancer Neg Hx     ALLERGIES:  has no known allergies.  MEDICATIONS:  Current Outpatient Medications  Medication Sig Dispense Refill   allopurinol  (ZYLOPRIM ) 300 MG tablet TAKE 1 TABLET(300 MG) BY MOUTH DAILY 90 tablet 0   amLODipine  (NORVASC ) 10  MG tablet Take 1 tablet (10 mg total) by mouth daily. 90 tablet 3   aspirin 81 MG tablet Take 81 mg by mouth daily.     fluorouracil  (EFUDEX ) 5 % cream Apply topically 2 (two) times daily. 80 g 0   hydroxyurea  (HYDREA ) 500 MG capsule Take 1 capsule (500 mg total) by mouth 3 (three) times daily. May take with food to minimize GI side effects. 90 capsule 4   metoprolol  succinate (TOPROL -XL) 100 MG 24 hr tablet TAKE 1 TABLET(100 MG) BY MOUTH DAILY WITH OR IMMEDIATELY FOLLOWING A MEAL 90 tablet 1   No current facility-administered medications for this visit.    PHYSICAL EXAMINATION: ECOG PERFORMANCE STATUS: 0 - Asymptomatic  Vitals:   07/17/24 1127  BP: (!) 159/83  Pulse:  95  Resp: 16  Temp: 97.8 F (36.6 C)  SpO2: 98%    Filed Weights   07/17/24 1127  Weight: 240 lb 11.2 oz (109.2 kg)    Physical Exam Constitutional:      Appearance: Normal appearance.  HENT:     Head: Normocephalic and atraumatic.  Cardiovascular:     Rate and Rhythm: Normal rate and regular rhythm.     Pulses: Normal pulses.     Heart sounds: Normal heart sounds.  Pulmonary:     Effort: Pulmonary effort is normal.     Breath sounds: Normal breath sounds.  Abdominal:     General: Abdomen is flat. Bowel sounds are normal.     Palpations: Abdomen is soft.  Musculoskeletal:        General: No swelling, tenderness or deformity.     Cervical back: Normal range of motion.  Skin:    General: Skin is warm and dry.  Neurological:     General: No focal deficit present.     Mental Status: He is alert.  Psychiatric:        Mood and Affect: Mood normal.        Behavior: Behavior normal.     LABORATORY DATA:  I have reviewed the data as listed Lab Results  Component Value Date   WBC 7.7 07/17/2024   HGB 14.7 07/17/2024   HCT 42.4 07/17/2024   MCV 98.1 07/17/2024   PLT 420 (H) 07/17/2024     Chemistry      Component Value Date/Time   NA 139 04/10/2024 1445   NA 139 06/14/2018 0000   K 4.4  04/10/2024 1445   CL 107 04/10/2024 1445   CO2 25 04/10/2024 1445   BUN 19 04/10/2024 1445   BUN 21 06/14/2018 0000   CREATININE 1.90 (H) 04/10/2024 1445   CREATININE 1.94 (H) 03/20/2024 1319   GLU 98 06/14/2018 0000      Component Value Date/Time   CALCIUM 9.1 04/10/2024 1445   ALKPHOS 57 04/10/2024 1445   AST 19 04/10/2024 1445   AST 15 03/20/2024 1319   ALT 12 04/10/2024 1445   ALT 8 03/20/2024 1319   BILITOT 0.8 04/10/2024 1445   BILITOT 1.0 03/20/2024 1319      Labs reviewed.  RADIOGRAPHIC STUDIES: I have personally reviewed the radiological images as listed and agreed with the findings in the report. I spent 20 minutes in the care of this patient including history, review of records, counseling and coordination of care All questions were answered. The patient knows to call the clinic with any problems, questions or concerns.    Amber Stalls, MD 07/17/2024 11:29 AM

## 2024-09-07 ENCOUNTER — Other Ambulatory Visit: Payer: Self-pay | Admitting: Internal Medicine

## 2024-09-28 ENCOUNTER — Other Ambulatory Visit: Payer: Self-pay | Admitting: Hematology and Oncology

## 2024-10-19 ENCOUNTER — Inpatient Hospital Stay: Admitting: Hematology and Oncology

## 2024-10-19 ENCOUNTER — Inpatient Hospital Stay: Attending: Hematology and Oncology

## 2024-10-19 VITALS — BP 146/84 | HR 106 | Temp 98.2°F | Resp 18 | Ht 72.0 in | Wt 246.7 lb

## 2024-10-19 DIAGNOSIS — Z801 Family history of malignant neoplasm of trachea, bronchus and lung: Secondary | ICD-10-CM | POA: Insufficient documentation

## 2024-10-19 DIAGNOSIS — Z7982 Long term (current) use of aspirin: Secondary | ICD-10-CM | POA: Diagnosis not present

## 2024-10-19 DIAGNOSIS — I129 Hypertensive chronic kidney disease with stage 1 through stage 4 chronic kidney disease, or unspecified chronic kidney disease: Secondary | ICD-10-CM | POA: Insufficient documentation

## 2024-10-19 DIAGNOSIS — D75839 Thrombocytosis, unspecified: Secondary | ICD-10-CM | POA: Insufficient documentation

## 2024-10-19 DIAGNOSIS — N189 Chronic kidney disease, unspecified: Secondary | ICD-10-CM | POA: Diagnosis not present

## 2024-10-19 DIAGNOSIS — Z87891 Personal history of nicotine dependence: Secondary | ICD-10-CM | POA: Insufficient documentation

## 2024-10-19 DIAGNOSIS — D473 Essential (hemorrhagic) thrombocythemia: Secondary | ICD-10-CM | POA: Insufficient documentation

## 2024-10-19 LAB — CMP (CANCER CENTER ONLY)
ALT: 11 U/L (ref 0–44)
AST: 22 U/L (ref 15–41)
Albumin: 4.3 g/dL (ref 3.5–5.0)
Alkaline Phosphatase: 74 U/L (ref 38–126)
Anion gap: 10 (ref 5–15)
BUN: 20 mg/dL (ref 8–23)
CO2: 25 mmol/L (ref 22–32)
Calcium: 9.2 mg/dL (ref 8.9–10.3)
Chloride: 105 mmol/L (ref 98–111)
Creatinine: 1.93 mg/dL — ABNORMAL HIGH (ref 0.61–1.24)
GFR, Estimated: 37 mL/min — ABNORMAL LOW
Glucose, Bld: 97 mg/dL (ref 70–99)
Potassium: 4.4 mmol/L (ref 3.5–5.1)
Sodium: 139 mmol/L (ref 135–145)
Total Bilirubin: 0.7 mg/dL (ref 0.0–1.2)
Total Protein: 7.8 g/dL (ref 6.5–8.1)

## 2024-10-19 LAB — CBC WITH DIFFERENTIAL (CANCER CENTER ONLY)
Abs Immature Granulocytes: 0.05 K/uL (ref 0.00–0.07)
Basophils Absolute: 0.1 K/uL (ref 0.0–0.1)
Basophils Relative: 1 %
Eosinophils Absolute: 0.1 K/uL (ref 0.0–0.5)
Eosinophils Relative: 1 %
HCT: 44.1 % (ref 39.0–52.0)
Hemoglobin: 15.3 g/dL (ref 13.0–17.0)
Immature Granulocytes: 1 %
Lymphocytes Relative: 20 %
Lymphs Abs: 2 K/uL (ref 0.7–4.0)
MCH: 33.6 pg (ref 26.0–34.0)
MCHC: 34.7 g/dL (ref 30.0–36.0)
MCV: 96.9 fL (ref 80.0–100.0)
Monocytes Absolute: 1.1 K/uL — ABNORMAL HIGH (ref 0.1–1.0)
Monocytes Relative: 11 %
Neutro Abs: 6.7 K/uL (ref 1.7–7.7)
Neutrophils Relative %: 66 %
Platelet Count: 404 K/uL — ABNORMAL HIGH (ref 150–400)
RBC: 4.55 MIL/uL (ref 4.22–5.81)
RDW: 13.2 % (ref 11.5–15.5)
WBC Count: 10 K/uL (ref 4.0–10.5)
nRBC: 0 % (ref 0.0–0.2)

## 2024-10-19 NOTE — Progress Notes (Signed)
 [facility] FOLLOW UP NOTE  Patient Care Team: Rollene Almarie LABOR, MD as PCP - General (Internal Medicine)  CHIEF COMPLAINTS/PURPOSE OF CONSULTATION:  ET follow up   ASSESSMENT & PLAN:   This is a very pleasant 69 year old male patient with past medical history significant for hypertension referred to hematology for evaluation of persistent and extreme thrombocytosis, CALR mutated.  ET, CALR mutated Assessment & Plan Essential thrombocythemia Chronic essential thrombocythemia with mildly elevated platelet count, previously better controlled on higher hydroxyurea  dose. Tolerating hydroxyurea  well, no new symptoms.  - Increased hydroxyurea  to three tablets daily (two in the morning, one in the afternoon) to optimize platelet control. - Continued daily low-dose aspirin. - Scheduled follow-up in three months with repeat labs - HTN managed by PCP - CKD appears to be stable.   HISTORY OF PRESENTING ILLNESS:   Howard Green 69 y.o. male is here because of ET   Oncology History   No problem history exists.   INTERIM HISTORY  History of Present Illness    The patient, with a history of CALR mutated ET, hypertension and kidney disease, presents for a routine follow-up  History of Present Illness Howard Green is a 69 year old male with thrombocytosis and atrial fibrillation who presents for follow-up of his blood condition.  He is currently managed with hydroxyurea , two tablets daily, and low-dose aspirin. Recent laboratory evaluation demonstrated a platelet count slightly above 400,000. He previously took three tablets daily of hydroxyurea . He has not experienced fevers, drenching night sweats, abnormal bleeding, or ecchymosis. Appetite is preserved and weight remains stable. He denies abdominal pain.  He is also treated for hypertension with amlodipine  and metoprolol , with no issues tolerating these medications. The only recent change has been an increase in  amlodipine  to 10 mg daily, which he has tolerated well without adverse effects.  Rest of the pertinent 10 point ROS reviewed and neg.  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      Social Drivers of Health   Tobacco Use: Low Risk (06/12/2024)   Patient History    Smoking Tobacco Use: Never    Smokeless Tobacco Use: Never    Passive Exposure: Not on file  Financial Resource Strain: Low Risk (06/12/2024)   Overall Financial Resource Strain (CARDIA)  Difficulty of Paying Living Expenses: Not very hard  Food Insecurity: No Food Insecurity (06/12/2024)   Epic    Worried About Programme Researcher, Broadcasting/film/video in the Last Year: Never true    Ran Out of Food in the Last Year: Never true  Transportation Needs: No Transportation Needs (06/12/2024)   Epic    Lack of Transportation (Medical): No    Lack of Transportation (Non-Medical): No  Physical Activity: Sufficiently Active (06/21/2023)   Exercise Vital Sign    Days of Exercise per Week:  7 days    Minutes of Exercise per Session: 60 min  Stress: No Stress Concern Present (06/12/2024)   [employer] of Occupational Health - Occupational Stress Questionnaire    Feeling of Stress: Not at all  Social Connections: Socially Isolated (06/12/2024)   Social Connection and Isolation Panel    Frequency of Communication with Friends and Family: More than three times a week    Frequency of Social Gatherings with Friends and Family: More than three times a week    Attends Religious Services: Never    Database Administrator or Organizations: No    Attends Banker Meetings: Never    Marital Status: Widowed  Intimate Partner Violence: Patient Unable To Answer (06/12/2024)   Epic    Fear of Current or Ex-Partner: Patient unable to answer    Emotionally Abused: Patient unable to answer    Physically Abused: Patient unable to answer    Sexually Abused: Patient unable to answer  Depression (PHQ2-9): Low Risk (06/12/2024)   Depression (PHQ2-9)    PHQ-2 Score: 0  Alcohol Screen: Low Risk (06/12/2024)   Alcohol Screen    Last Alcohol Screening Score (AUDIT): 0  Housing: Unknown (06/12/2024)   Epic    Unable to Pay for Housing in the Last Year: No    Number of Times Moved in the Last Year: Not on file    Homeless in the Last Year: No  Utilities: Not At Risk (06/12/2024)   Epic    Threatened with loss of utilities: No  Health Literacy: Adequate Health Literacy (06/12/2024)   B1300 Health Literacy    Frequency of need for help with medical instructions: Never    FAMILY HISTORY: Family History  Problem Relation Age of Onset   Diabetes Mother    Other Mother        Good Pastures disease   Lung cancer Father        passed 49   Healthy Brother    Healthy Sister    Healthy Son    Healthy Daughter    Colon cancer Neg Hx    Rectal cancer Neg Hx    Stomach cancer Neg Hx     ALLERGIES:  has no known allergies.  MEDICATIONS:  Current Outpatient Medications  Medication Sig  Dispense Refill   allopurinol  (ZYLOPRIM ) 300 MG tablet TAKE 1 TABLET(300 MG) BY MOUTH DAILY 90 tablet 0   amLODipine  (NORVASC ) 10 MG tablet Take 1 tablet (10 mg total) by mouth daily. 90 tablet 3   aspirin 81 MG tablet Take 81 mg by mouth daily.     fluorouracil  (EFUDEX ) 5 % cream Apply topically 2 (two) times daily. 80 g 0   hydroxyurea  (HYDREA ) 500 MG capsule TAKE 1 CAPSULE(500 MG) BY MOUTH THREE TIMES DAILY. MAY TAKE WITH FOOD TO MINIMIZE GI SIDE EFFECTS 90 capsule 4   metoprolol  succinate (TOPROL -XL) 100 MG 24 hr tablet TAKE 1 TABLET(100 MG) BY MOUTH DAILY WITH  OR IMMEDIATELY FOLLOWING A MEAL 90 tablet 1   No current facility-administered medications for this visit.    PHYSICAL EXAMINATION: ECOG PERFORMANCE STATUS: 0 - Asymptomatic  Vitals:   10/19/24 1100  BP: (!) 146/84  Pulse: (!) 106  Resp: 18  Temp: 98.2 F (36.8 C)  SpO2: 98%    Filed Weights   10/19/24 1100  Weight: 246 lb 11.2 oz (111.9 kg)    Physical Exam Constitutional:      Appearance: Normal appearance.  HENT:     Head: Normocephalic and atraumatic.  Cardiovascular:     Rate and Rhythm: Normal rate and regular rhythm.     Pulses: Normal pulses.     Heart sounds: Normal heart sounds.  Pulmonary:     Effort: Pulmonary effort is normal.     Breath sounds: Normal breath sounds.  Abdominal:     General: Abdomen is flat. Bowel sounds are normal.     Palpations: Abdomen is soft.  Musculoskeletal:        General: No swelling, tenderness or deformity.     Cervical back: Normal range of motion.  Skin:    General: Skin is warm and dry.  Neurological:     General: No focal deficit present.     Mental Status: He is alert.  Psychiatric:        Mood and Affect: Mood normal.        Behavior: Behavior normal.     LABORATORY DATA:  I have reviewed the data as listed Lab Results  Component Value Date   WBC 10.0 10/19/2024   HGB 15.3 10/19/2024   HCT 44.1 10/19/2024   MCV 96.9 10/19/2024   PLT 404 (H)  10/19/2024     Chemistry      Component Value Date/Time   NA 141 07/17/2024 1057   NA 139 06/14/2018 0000   K 4.3 07/17/2024 1057   CL 109 07/17/2024 1057   CO2 27 07/17/2024 1057   BUN 18 07/17/2024 1057   BUN 21 06/14/2018 0000   CREATININE 1.62 (H) 07/17/2024 1057   GLU 98 06/14/2018 0000      Component Value Date/Time   CALCIUM 9.7 07/17/2024 1057   ALKPHOS 60 07/17/2024 1057   AST 15 07/17/2024 1057   ALT 7 07/17/2024 1057   BILITOT 0.8 07/17/2024 1057      Labs reviewed.  RADIOGRAPHIC STUDIES: I have personally reviewed the radiological images as listed and agreed with the findings in the report.  I spent 20 minutes in the care of this patient including history, review of records, counseling and coordination of care  All questions were answered. The patient knows to call the clinic with any problems, questions or concerns.    Amber Stalls, MD 10/19/2024 11:57 AM

## 2025-01-22 ENCOUNTER — Inpatient Hospital Stay

## 2025-01-22 ENCOUNTER — Inpatient Hospital Stay: Admitting: Hematology and Oncology

## 2025-06-13 ENCOUNTER — Ambulatory Visit
